# Patient Record
Sex: Female | Born: 1980 | Race: White | Hispanic: No | Marital: Married | Smoking: Former smoker
Health system: Southern US, Community
[De-identification: ages and names within clinical notes are randomized; demographics above are authoritative.]

## PROBLEM LIST (undated history)

## (undated) DIAGNOSIS — F329 Major depressive disorder, single episode, unspecified: Secondary | ICD-10-CM

## (undated) DIAGNOSIS — K589 Irritable bowel syndrome without diarrhea: Secondary | ICD-10-CM

## (undated) DIAGNOSIS — M2550 Pain in unspecified joint: Secondary | ICD-10-CM

## (undated) DIAGNOSIS — Z803 Family history of malignant neoplasm of breast: Secondary | ICD-10-CM

## (undated) DIAGNOSIS — I10 Essential (primary) hypertension: Secondary | ICD-10-CM

## (undated) DIAGNOSIS — R7303 Prediabetes: Secondary | ICD-10-CM

## (undated) DIAGNOSIS — G8929 Other chronic pain: Secondary | ICD-10-CM

## (undated) DIAGNOSIS — Z87442 Personal history of urinary calculi: Secondary | ICD-10-CM

## (undated) DIAGNOSIS — K625 Hemorrhage of anus and rectum: Secondary | ICD-10-CM

## (undated) DIAGNOSIS — J9601 Acute respiratory failure with hypoxia: Secondary | ICD-10-CM

## (undated) DIAGNOSIS — F32A Depression, unspecified: Secondary | ICD-10-CM

## (undated) DIAGNOSIS — J9691 Respiratory failure, unspecified with hypoxia: Secondary | ICD-10-CM

## (undated) DIAGNOSIS — J111 Influenza due to unidentified influenza virus with other respiratory manifestations: Secondary | ICD-10-CM

## (undated) DIAGNOSIS — F411 Generalized anxiety disorder: Secondary | ICD-10-CM

## (undated) HISTORY — DX: Family history of malignant neoplasm of breast: Z80.3

---
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
## 2008-08-02 ENCOUNTER — Observation Stay: Payer: Self-pay

## 2008-08-04 ENCOUNTER — Inpatient Hospital Stay: Payer: Self-pay

## 2016-10-31 ENCOUNTER — Emergency Department: Payer: BC Managed Care – PPO

## 2016-10-31 ENCOUNTER — Emergency Department
Admission: EM | Admit: 2016-10-31 | Discharge: 2016-10-31 | Disposition: A | Discharge status: Home / Self Care | Payer: BC Managed Care – PPO | Attending: Emergency Medicine | Admitting: Emergency Medicine

## 2016-10-31 ENCOUNTER — Encounter: Payer: Self-pay | Admitting: Emergency Medicine

## 2016-10-31 DIAGNOSIS — N202 Calculus of kidney with calculus of ureter: Secondary | ICD-10-CM | POA: Insufficient documentation

## 2016-10-31 DIAGNOSIS — N200 Calculus of kidney: Secondary | ICD-10-CM

## 2016-10-31 DIAGNOSIS — R109 Unspecified abdominal pain: Secondary | ICD-10-CM | POA: Diagnosis present

## 2016-10-31 LAB — URINALYSIS COMPLETE WITH MICROSCOPIC (ARMC ONLY)
BILIRUBIN URINE: NEGATIVE
Glucose, UA: NEGATIVE mg/dL
Ketones, ur: NEGATIVE mg/dL
Nitrite: NEGATIVE
PH: 5 (ref 5.0–8.0)
PROTEIN: 100 mg/dL — AB
Specific Gravity, Urine: 1.025 (ref 1.005–1.030)

## 2016-10-31 LAB — COMPREHENSIVE METABOLIC PANEL
ALBUMIN: 3.8 g/dL (ref 3.5–5.0)
ALT: 24 U/L (ref 14–54)
AST: 24 U/L (ref 15–41)
Alkaline Phosphatase: 68 U/L (ref 38–126)
Anion gap: 9 (ref 5–15)
BUN: 15 mg/dL (ref 6–20)
CHLORIDE: 110 mmol/L (ref 101–111)
CO2: 19 mmol/L — AB (ref 22–32)
CREATININE: 0.95 mg/dL (ref 0.44–1.00)
Calcium: 8.8 mg/dL — ABNORMAL LOW (ref 8.9–10.3)
GFR calc Af Amer: >60 mL/min (ref >60)
GLUCOSE: 103 mg/dL — AB (ref 65–99)
Potassium: 3.7 mmol/L (ref 3.5–5.1)
Sodium: 138 mmol/L (ref 135–145)
Total Bilirubin: 0.3 mg/dL (ref 0.3–1.2)
Total Protein: 7.6 g/dL (ref 6.5–8.1)

## 2016-10-31 LAB — CBC WITH DIFFERENTIAL/PLATELET
BASOS ABS: 0.1 10*3/uL (ref 0–0.1)
BASOS PCT: 1 %
EOS ABS: 0.1 10*3/uL (ref 0–0.7)
Eosinophils Relative: 0 %
HEMATOCRIT: 43.4 % (ref 35.0–47.0)
HEMOGLOBIN: 14.7 g/dL (ref 12.0–16.0)
Lymphocytes Relative: 20 %
Lymphs Abs: 2.8 10*3/uL (ref 1.0–3.6)
MCH: 30.7 pg (ref 26.0–34.0)
MCHC: 33.8 g/dL (ref 32.0–36.0)
MCV: 90.8 fL (ref 80.0–100.0)
MONOS PCT: 7 %
Monocytes Absolute: 1 10*3/uL — ABNORMAL HIGH (ref 0.2–0.9)
NEUTROS ABS: 10 10*3/uL — AB (ref 1.4–6.5)
NEUTROS PCT: 72 %
Platelets: 323 10*3/uL (ref 150–440)
RBC: 4.77 MIL/uL (ref 3.80–5.20)
RDW: 13.2 % (ref 11.5–14.5)
WBC: 13.9 10*3/uL — AB (ref 3.6–11.0)

## 2016-10-31 LAB — PREGNANCY, URINE: PREG TEST UR: NEGATIVE

## 2016-10-31 MED ORDER — ONDANSETRON HCL 4 MG/2ML IJ SOLN
INTRAMUSCULAR | Status: AC
  Filled 2016-10-31: qty 2

## 2016-10-31 MED ORDER — ONDANSETRON HCL 4 MG/2ML IJ SOLN
4.0000 mg | Freq: Once | INTRAMUSCULAR | Status: AC
  Administered 2016-10-31: 4 mg via INTRAVENOUS

## 2016-10-31 MED ORDER — HYDROMORPHONE HCL 1 MG/ML IJ SOLN
1.0000 mg | Freq: Once | INTRAMUSCULAR | Status: AC
  Administered 2016-10-31: 1 mg via INTRAVENOUS

## 2016-10-31 MED ORDER — OXYCODONE-ACETAMINOPHEN 7.5-325 MG PO TABS: 1.0000 | ORAL_TABLET | ORAL | 0 refills | Status: AC | PRN

## 2016-10-31 MED ORDER — KETOROLAC TROMETHAMINE 30 MG/ML IJ SOLN
INTRAMUSCULAR | Status: AC
  Administered 2016-10-31: 30 mg via INTRAVENOUS
  Filled 2016-10-31: qty 1

## 2016-10-31 MED ORDER — HYDROMORPHONE HCL 1 MG/ML IJ SOLN
INTRAMUSCULAR | Status: AC
  Administered 2016-10-31: 1 mg via INTRAVENOUS
  Filled 2016-10-31: qty 1

## 2016-10-31 MED ORDER — ONDANSETRON HCL 4 MG/2ML IJ SOLN
INTRAMUSCULAR | Status: AC
  Administered 2016-10-31: 4 mg via INTRAVENOUS
  Filled 2016-10-31: qty 2

## 2016-10-31 MED ORDER — TAMSULOSIN HCL 0.4 MG PO CAPS: 0.4000 mg | ORAL_CAPSULE | Freq: Every day | ORAL | 0 refills | Status: DC

## 2016-10-31 MED ORDER — KETOROLAC TROMETHAMINE 30 MG/ML IJ SOLN
30.0000 mg | Freq: Once | INTRAMUSCULAR | Status: AC
  Administered 2016-10-31: 30 mg via INTRAVENOUS

## 2016-10-31 MED ORDER — CEPHALEXIN 250 MG PO CAPS: 250.0000 mg | ORAL_CAPSULE | Freq: Four times a day (QID) | ORAL | 0 refills | Status: AC

## 2016-10-31 MED ORDER — ONDANSETRON HCL 4 MG PO TABS: 4.0000 mg | ORAL_TABLET | Freq: Every day | ORAL | 1 refills | Status: DC | PRN

## 2016-10-31 NOTE — ED Triage Notes (Signed)
Pt with right side flank pain radiating around into back. Pt with hx of kidney stones.

## 2016-10-31 NOTE — ED Provider Notes (Signed)
[facility] Emergency Department Provider Note        Time seen: ----------------------------------------- 5:32 PM on 10/31/2016 -----------------------------------------    I have reviewed the triage vital signs and the nursing notes.   HISTORY  Chief Complaint Flank Pain    HPI Evelyn Beeker is a 35 y.o. female who presents the ER for right-sided flank pain that radiates into her back. She doesn't history kidney stones, she's had associated nausea and vomiting with the pain. She states her daughter has been sick so she initially thought she had a virus. Subsequently the right flank pain started. Pain is 9 out of 10   History reviewed. No pertinent past medical history.  There are no active problems to display for this patient.   History reviewed. No pertinent surgical history.  Allergies Phenergan [promethazine hcl]  Social History Social History  Substance Use Topics  . Smoking status: Never Smoker  . Smokeless tobacco: Never Used  . Alcohol use No    Review of Systems Constitutional: Negative for fever. Cardiovascular: Negative for chest pain. Respiratory: Negative for shortness of breath. Gastrointestinal: Positive for flank pain, vomiting Genitourinary: Negative for dysuria. Musculoskeletal: Negative for back pain. Skin: Negative for rash. Neurological: Negative for headaches, focal weakness or numbness.  10-point ROS otherwise negative.  ____________________________________________   PHYSICAL EXAM:  VITAL SIGNS: ED Triage Vitals  Enc Vitals Group     BP 10/31/16 1655 (!) 187/111     Pulse Rate 10/31/16 1655 79     Resp 10/31/16 1655 18     Temp 10/31/16 1655 97.5 F (36.4 C)     Temp Source 10/31/16 1655 Oral     SpO2 10/31/16 1655 100 %     Weight 10/31/16 1656 280 lb (127 kg)     Height 10/31/16 1656 6\' 1"  (1.854 m)     Head Circumference --      Peak Flow --      Pain Score 10/31/16 1655 9     Pain Loc --       Pain Edu? --      Excl. in [place]? --     Constitutional: Alert and oriented. Mild distress Eyes: Conjunctivae are normal. PERRL. Normal extraocular movements. ENT   Head: Normocephalic and atraumatic.   Nose: No congestion/rhinnorhea.   Mouth/Throat: Mucous membranes are moist.   Neck: No stridor. Cardiovascular: Normal rate, regular rhythm. No murmurs, rubs, or gallops. Respiratory: Normal respiratory effort without tachypnea nor retractions. Breath sounds are clear and equal bilaterally. No wheezes/rales/rhonchi. Gastrointestinal: Right flank tenderness, normal bowel sounds. Musculoskeletal: Nontender with normal range of motion in all extremities. No lower extremity tenderness nor edema. Neurologic:  Normal speech and language. No gross focal neurologic deficits are appreciated.  Skin:  Skin is warm, dry and intact. No rash noted. Psychiatric: Mood and affect are normal. Speech and behavior are normal.  ____________________________________________  ED COURSE:  Pertinent labs & imaging results that were available during my care of the patient were reviewed by me and considered in my medical decision making (see chart for details). Clinical Course   Patient is in mild distress from likely renal colic. We'll check basic labs and imaging.  Procedures ____________________________________________   LABS (pertinent positives/negatives)  Labs Reviewed  URINALYSIS COMPLETEWITH MICROSCOPIC ([facility] ONLY) - Abnormal; Notable for the following:       Result Value   Color, Urine YELLOW (*)    APPearance CLOUDY (*)    Hgb urine dipstick 3+ (*)  Protein, ur 100 (*)    Leukocytes, UA 2+ (*)    Bacteria, UA FEW (*)    Squamous Epithelial / LPF TOO NUMEROUS TO COUNT (*)    All other components within normal limits  CBC WITH DIFFERENTIAL/PLATELET - Abnormal; Notable for the following:    WBC 13.9 (*)    Neutro Abs 10.0 (*)    Monocytes Absolute 1.0 (*)    All other  components within normal limits  COMPREHENSIVE METABOLIC PANEL - Abnormal; Notable for the following:    CO2 19 (*)    Glucose, Bld 103 (*)    Calcium 8.8 (*)    All other components within normal limits  PREGNANCY, URINE    RADIOLOGY Images were viewed by me  KUB IMPRESSION: 6 mm calculus overlying the right lower kidney.  Possible 4 mm distal right ureteral calculus versus pelvic phlebolith. ____________________________________________  FINAL ASSESSMENT AND PLAN  Flank pain, Renal colic  Plan: Patient with labs and imaging as dictated above. Patient presented with flank pain consistent with renal colic. Labs and imaging are indicative of same. She'll be discharged with pain medicine, Flomax and antiemetics. She'll be referred to urology for outpatient follow-up.   Earleen Newport, MD   Note: This dictation was prepared with Dragon dictation. Any transcriptional errors that result from this process are unintentional    Earleen Newport, MD 10/31/16 1850

## 2016-11-28 ENCOUNTER — Encounter: Payer: Self-pay | Admitting: *Deleted

## 2016-11-29 ENCOUNTER — Ambulatory Visit
Admission: RE | Admit: 2016-11-29 | Discharge: 2016-11-29 | Disposition: A | Discharge status: Home / Self Care | Payer: BC Managed Care – PPO | Source: Ambulatory Visit | Attending: Urology | Admitting: Urology

## 2016-11-29 ENCOUNTER — Encounter
Admission: RE | Disposition: A | Discharge status: Home / Self Care | Payer: Self-pay | Source: Ambulatory Visit | Attending: Urology

## 2016-11-29 ENCOUNTER — Encounter: Payer: Self-pay | Admitting: *Deleted

## 2016-11-29 DIAGNOSIS — E669 Obesity, unspecified: Secondary | ICD-10-CM | POA: Diagnosis not present

## 2016-11-29 DIAGNOSIS — N200 Calculus of kidney: Secondary | ICD-10-CM

## 2016-11-29 DIAGNOSIS — F419 Anxiety disorder, unspecified: Secondary | ICD-10-CM | POA: Insufficient documentation

## 2016-11-29 DIAGNOSIS — Z87891 Personal history of nicotine dependence: Secondary | ICD-10-CM | POA: Insufficient documentation

## 2016-11-29 DIAGNOSIS — Z6837 Body mass index (BMI) 37.0-37.9, adult: Secondary | ICD-10-CM | POA: Diagnosis not present

## 2016-11-29 DIAGNOSIS — F329 Major depressive disorder, single episode, unspecified: Secondary | ICD-10-CM | POA: Diagnosis not present

## 2016-11-29 DIAGNOSIS — Z79899 Other long term (current) drug therapy: Secondary | ICD-10-CM | POA: Insufficient documentation

## 2016-11-29 HISTORY — DX: Personal history of urinary calculi: Z87.442

## 2016-11-29 HISTORY — DX: Other chronic pain: G89.29

## 2016-11-29 HISTORY — DX: Pain in unspecified joint: M25.50

## 2016-11-29 HISTORY — DX: Depression, unspecified: F32.A

## 2016-11-29 HISTORY — PX: EXTRACORPOREAL SHOCK WAVE LITHOTRIPSY: SHX1557

## 2016-11-29 HISTORY — DX: Major depressive disorder, single episode, unspecified: F32.9

## 2016-11-29 LAB — POCT PREGNANCY, URINE: Preg Test, Ur: NEGATIVE

## 2016-11-29 SURGERY — LITHOTRIPSY, ESWL
Anesthesia: Moderate Sedation | Laterality: Right

## 2016-11-29 MED ORDER — ONDANSETRON HCL 4 MG/2ML IJ SOLN
INTRAMUSCULAR | Status: AC
  Filled 2016-11-29: qty 8

## 2016-11-29 MED ORDER — MORPHINE SULFATE (PF) 10 MG/ML IV SOLN
INTRAVENOUS | Status: AC
  Filled 2016-11-29: qty 1

## 2016-11-29 MED ORDER — MIDAZOLAM HCL 2 MG/2ML IJ SOLN
INTRAMUSCULAR | Status: AC
  Filled 2016-11-29: qty 2

## 2016-11-29 MED ORDER — MIDAZOLAM HCL 2 MG/2ML IJ SOLN
1.0000 mg | Freq: Once | INTRAMUSCULAR | Status: AC
  Administered 2016-11-29: 1 mg via INTRAMUSCULAR

## 2016-11-29 MED ORDER — DIPHENHYDRAMINE HCL 25 MG PO CAPS
25.0000 mg | ORAL_CAPSULE | ORAL | Status: AC
  Administered 2016-11-29: 25 mg via ORAL

## 2016-11-29 MED ORDER — LEVOFLOXACIN 500 MG PO TABS
500.0000 mg | ORAL_TABLET | ORAL | Status: AC
  Administered 2016-11-29: 500 mg via ORAL

## 2016-11-29 MED ORDER — LEVOFLOXACIN 500 MG PO TABS
ORAL_TABLET | ORAL | Status: AC
  Filled 2016-11-29: qty 1

## 2016-11-29 MED ORDER — DEXTROSE-NACL 5-0.45 % IV SOLN
INTRAVENOUS | Status: DC
  Administered 2016-11-29: 14:00:00 via INTRAVENOUS

## 2016-11-29 MED ORDER — DIPHENHYDRAMINE HCL 25 MG PO CAPS
ORAL_CAPSULE | ORAL | Status: AC
  Filled 2016-11-29: qty 1

## 2016-11-29 MED ORDER — MORPHINE SULFATE (PF) 10 MG/ML IV SOLN
10.0000 mg | Freq: Once | INTRAVENOUS | Status: AC
  Administered 2016-11-29: 10 mg via INTRAMUSCULAR

## 2016-11-29 MED ORDER — FUROSEMIDE 10 MG/ML IJ SOLN
10.0000 mg | Freq: Once | INTRAMUSCULAR | Status: AC
  Administered 2016-11-29: 10 mg via INTRAVENOUS

## 2016-11-29 MED ORDER — ONDANSETRON HCL 4 MG/2ML IJ SOLN
4.0000 mg | Freq: Once | INTRAMUSCULAR | Status: AC
  Administered 2016-11-29: 4 mg via INTRAVENOUS

## 2016-11-29 MED ORDER — FUROSEMIDE 10 MG/ML IJ SOLN
INTRAMUSCULAR | Status: AC
Start: 2016-11-29 — End: 2016-11-29
  Administered 2016-11-29: 10 mg via INTRAVENOUS
  Filled 2016-11-29: qty 2

## 2016-11-29 NOTE — Discharge Instructions (Signed)
Dietary Guidelines to Help Prevent Kidney Stones Your risk of kidney stones can be decreased by adjusting the foods you eat. The most important thing you can do is drink enough fluid. You should drink enough fluid to keep your urine clear or pale yellow. The following guidelines provide specific information for the type of kidney stone you have had. Guidelines according to type of kidney stone Calcium Oxalate Kidney Stones  Reduce the amount of salt you eat. Foods that have a lot of salt cause your body to release excess calcium into your urine. The excess calcium can combine with a substance called oxalate to form kidney stones.  Reduce the amount of animal protein you eat if the amount you eat is excessive. Animal protein causes your body to release excess calcium into your urine. Ask your dietitian how much protein from animal sources you should be eating.  Avoid foods that are high in oxalates. If you take vitamins, they should have less than 500 mg of vitamin C. Your body turns vitamin C into oxalates. You do not need to avoid fruits and vegetables high in vitamin C. Calcium Phosphate Kidney Stones  Reduce the amount of salt you eat to help prevent the release of excess calcium into your urine.  Reduce the amount of animal protein you eat if the amount you eat is excessive. Animal protein causes your body to release excess calcium into your urine. Ask your dietitian how much protein from animal sources you should be eating.  Get enough calcium from food or take a calcium supplement (ask your dietitian for recommendations). Food sources of calcium that do not increase your risk of kidney stones include:  Broccoli.  Dairy products, such as cheese and yogurt.  Pudding. Uric Acid Kidney Stones  Do not have more than 6 oz of animal protein per day. Food sources Publishing rights manager (all types).  Poultry.  Eggs.  Fish, seafood. Foods High in [employer] seasonings.  Soy  sauce.  Teriyaki sauce.  Cured and processed meats.  Salted crackers and snack foods.  Fast food.  Canned soups and most canned foods. Foods High in Oxalates  Grains:  Amaranth.  Barley.  Grits.  Wheat germ.  Bran.  Buckwheat flour.  All bran cereals.  Pretzels.  Whole wheat bread.  Vegetables:  Beans (wax).  Beets and beet greens.  Collard greens.  Eggplant.  [place].  Leeks.  Okra.  Parsley.  Rutabagas.  Spinach.  Swiss chard.  Tomato paste.  Fried potatoes.  Sweet potatoes.  Fruits:  Red currants.  Figs.  Kiwi.  Rhubarb.  Meat and Other Protein Sources:  Beans (dried).  Soy burgers and other soybean products.  Miso.  Nuts (peanuts, almonds, pecans, cashews, hazelnuts).  Nut butters.  Sesame seeds and tahini (paste made of sesame seeds).  Poppy seeds.  Beverages:  Chocolate drink mixes.  Soy milk.  Instant iced tea.  Juices made from high-oxalate fruits or vegetables.  Other:  Carob.  Chocolate.  Fruitcake.  Marmalades. This information is not intended to replace advice given to you by your health care provider. Make sure you discuss any questions you have with your health care provider. Document Released: 04/06/2011 Document Revised: 05/17/2016 Document Reviewed: 11/06/2013 Elsevier Interactive Patient Education  2017 [place].   Kidney Stones Kidney stones (urolithiasis) are solid, rock-like deposits that form inside of the organs that make urine (kidneys). A kidney stone may form in a kidney and move into the bladder, where  it can cause intense pain and block the flow of urine. Kidney stones are created when high levels of certain minerals are found in the urine. They are usually passed through urination, but in some cases, medical treatment may be needed to remove them. What are the causes? Kidney stones may be caused by:  A condition in which certain glands produce too much parathyroid  hormone (primary hyperparathyroidism), which causes too much calcium buildup in the blood.  Buildup of uric acid crystals in the bladder (hyperuricosuria). Uric acid is a chemical that the body produces when you eat certain foods. It usually exits the body in the urine.  Narrowing (stricture) of one or both of the tubes that drain urine from the kidneys to the bladder (ureters).  A kidney blockage that is present at birth (congenital obstruction).  Past surgery on the kidney or the ureters, such as gastric bypass surgery. What increases the risk? The following factors make you more likely to develop kidney stones:  Having had a kidney stone in the past.  Having a family history of kidney stones.  Not drinking enough water.  Eating a diet that is high in protein, salt (sodium), or sugar.  Being overweight or obese. What are the signs or symptoms? Symptoms of a kidney stone may include:  Nausea.  Vomiting.  Blood in the urine (hematuria).  Pain in the side of the abdomen, right below the ribs (flank pain). Pain usually spreads (radiates) to the groin.  Needing to urinate frequently or urgently. How is this diagnosed? This condition may be diagnosed based on:  Your medical history.  A physical exam.  Blood tests.  Urine tests.  CT scan.  Abdominal X-ray.  A procedure to examine the inside of the bladder (cystoscopy). How is this treated? Treatment for kidney stones depends on the size, location, and makeup of the stones. Treatment may involve:  Analyzing your urine before and after you pass the stone through urination.  Being monitored at the hospital until you pass the stone through urination.  Increasing your fluid intake and decreasing the amount of calcium and protein in your diet.  A procedure to break up kidney stones in the bladder using:  A focused beam of light (laser therapy).  Shock waves (extracorporeal shock wave lithotripsy).  Surgery to  remove kidney stones. This may be needed if you have severe pain or have stones that block your urinary tract. Follow these instructions at home: Eating and drinking  Drink enough fluid to keep your urine clear or pale yellow. This will help you to pass the kidney stone.  If directed, change your diet. This may include:  Limiting how much sodium you eat.  Eating more fruits and vegetables.  Limiting how much meat, poultry, fish, and eggs you eat.  Follow instructions from your health care provider about eating or drinking restrictions. General instructions  Collect urine samples as told by your health care provider. You may need to collect a urine sample:  24 hours after you pass the stone.  8-12 weeks after passing the kidney stone, and every 6-12 months after that.  Strain your urine every time you urinate, for as long as directed. Use the strainer that your health care provider recommends.  Do not throw out the kidney stone after passing it. Keep the stone so it can be tested by your health care provider. Testing the makeup of your kidney stone may help prevent you from getting kidney stones in the future.  Take over-the-counter and prescription medicines only as told by your health care provider.  Keep all follow-up visits as told by your health care provider. This is important. You may need follow-up X-rays or ultrasounds to make sure that your stone has passed. How is this prevented? To prevent another kidney stone:  Drink enough fluid to keep your urine clear or pale yellow. This is the best way to prevent kidney stones.  Eat a healthy diet and follow recommendations from your health care provider about foods to avoid. You may be instructed to eat a low-protein diet. Recommendations vary depending on the type of kidney stone that you have.  Maintain a healthy weight. Contact a health care provider if:  You have pain that gets worse or does not get better with  medicine. Get help right away if:  You have a fever or chills.  You develop severe pain.  You develop new abdominal pain.  You faint.  You are unable to urinate. This information is not intended to replace advice given to you by your health care provider. Make sure you discuss any questions you have with your health care provider. Document Released: 12/10/2005 Document Revised: 06/29/2016 Document Reviewed: 05/25/2016 Elsevier Interactive Patient Education  2017 [place].   Renal Colic Renal colic is pain that is caused by passing a kidney stone. The pain can be sharp and severe. It may be felt in the back, abdomen, side (flank), or groin. It can cause nausea. Renal colic can come and go. Follow these instructions at home: Watch your condition for any changes. The following actions may help to lessen any discomfort that you are feeling:  Take medicines only as directed by your health care provider.  Ask your health care provider if it is okay to take over-the-counter pain medicine.  Drink enough fluid to keep your urine clear or pale yellow. Drink 6-8 glasses of water each day.  Limit the amount of salt that you eat to less than 2 grams per day.  Reduce the amount of protein in your diet. Eat less meat, fish, nuts, and dairy.  Avoid foods such as spinach, rhubarb, nuts, or bran. These may make kidney stones more likely to form. Contact a health care provider if:  You have a fever or chills.  Your urine smells bad or looks cloudy.  You have pain or burning when you pass urine. Get help right away if:  Your flank pain or groin pain suddenly worsens.  You become confused or disoriented or you lose consciousness. This information is not intended to replace advice given to you by your health care provider. Make sure you discuss any questions you have with your health care provider. Document Released: 09/19/2005 Document Revised: 05/15/2016 Document Reviewed:  10/20/2014 Elsevier Interactive Patient Education  2017 [place]. Lithotripsy Lithotripsy is a treatment that can sometimes help eliminate kidney stones and pain that they cause. A form of lithotripsy, also known as extracorporeal shock wave lithotripsy, is a nonsurgical procedure that helps your body rid itself of the kidney stone when it is too big to pass on its own. Extracorporeal shock wave lithotripsy is a method of crushing a kidney stone with shock waves. These shock waves pass through your body and are focused on your stone. They cause the kidney stones to crumble while still in the urinary tract. It is then easier for the smaller pieces of stone to pass in the urine. Lithotripsy usually takes about an hour. It is done in  a hospital, a lithotripsy center, or a mobile unit. It usually does not require an overnight stay. Your health care provider will instruct you on preparation for the procedure. Your health care provider will tell you what to expect afterward. LET [facility] CARE PROVIDER KNOW ABOUT:  Any allergies you have.  All medicines you are taking, including vitamins, herbs, eye drops, creams, and over-the-counter medicines.  Previous problems you or members of your family have had with the use of anesthetics.  Any blood disorders you have.  Previous surgeries you have had.  Medical conditions you have. RISKS AND COMPLICATIONS Generally, lithotripsy for kidney stones is a safe procedure. However, as with any procedure, complications can occur. Possible complications include:  Infection.  Bleeding of the kidney.  Bruising of the kidney or skin.  Obstruction of the ureter.  Failure of the stone to fragment. BEFORE THE PROCEDURE  Do not eat or drink for 6-8 hours prior to the procedure. You may, however, take the medications with a sip of water that your physician instructs you to take  Do not take aspirin or aspirin-containing products for 7 days prior to your  procedure  Do not take nonsteroidal anti-inflammatory products for 7 days prior to your procedure PROCEDURE A stent (flexible tube with holes) may be placed in your ureter. The ureter is the tube that transports the urine from the kidneys to the bladder. Your health care provider may place a stent before the procedure. This will help keep urine flowing from the kidney if the fragments of the stone block the ureter. You may have an IV tube placed in one of your veins to give you fluids and medicines. These medicines may help you relax or make you sleep. During the procedure, you will lie comfortably on a fluid-filled cushion or in a warm-water bath. After an X-ray or ultrasound exam to locate your stone, shock waves are aimed at the stone. If you are awake, you may feel a tapping sensation as the shock waves pass through your body. If large stone particles remain after treatment, a second procedure may be necessary at a later date. For comfort during the test:  Relax as much as possible.  Try to remain still as much as possible.  Try to follow instructions to speed up the test.  Let your health care provider know if you are uncomfortable, anxious, or in pain. AFTER THE PROCEDURE  After surgery, you will be taken to the recovery area. A nurse will watch and check your progress. Once you're awake, stable, and taking fluids well, you will be allowed to go home as long as there are no problems. You will also be allowed to pass your urine before discharge.You may be given antibiotics to help prevent infection. You may also be prescribed pain medicine if needed. In a week or two, your health care provider may remove your stent, if you have one. You may first have an X-ray exam to check on how successful the fragmentation of your stone has been and how much of the stone has passed. Your health care provider will check to see whether or not stone particles remain. SEEK IMMEDIATE MEDICAL CARE IF:  You  develop a fever or shaking chills.  Your pain is not relieved by medicine.  You feel sick to your stomach (nauseated) and you vomit.  You develop heavy bleeding.  You have difficulty urinating.  You start to pass your stent from your penis. This information is not intended to  replace advice given to you by your health care provider. Make sure you discuss any questions you have with your health care provider. Document Released: 12/07/2000 Document Revised: 12/31/2014 Document Reviewed: 06/25/2013 Elsevier Interactive Patient Education  2017 [place]. Lithotripsy, Care After Refer to this sheet in the next few weeks. These instructions provide you with information on caring for yourself after your procedure. Your health care provider may also give you more specific instructions. Your treatment has been planned according to current medical practices, but problems sometimes occur. Call your health care provider if you have any problems or questions after your procedure. WHAT TO EXPECT AFTER THE PROCEDURE   Your urine may have a red tinge for a few days after treatment. Blood loss is usually minimal.  You may have soreness in the back or flank area. This usually goes away after a few days. The procedure can cause blotches or bruises on the back where the pressure wave enters the skin. These marks usually cause only minimal discomfort and should disappear in a short time.  Stone fragments should begin to pass within 24 hours of treatment. However, a delayed passage is not unusual.  You may have pain, discomfort, and feel sick to your stomach (nauseated) when the crushed fragments of stone are passed down the tube from the kidney to the bladder. Stone fragments can pass soon after the procedure and may last for up to 4-8 weeks.  A small number of patients may have severe pain when stone fragments are not able to pass, which leads to an obstruction.  If your stone is greater than 1 inch (2.5  cm) in diameter or if you have multiple stones that have a combined diameter greater than 1 inch (2.5 cm), you may require more than one treatment.  If you had a stent placed prior to your procedure, you may experience some discomfort, especially during urination. You may experience the pain or discomfort in your flank or back, or you may experience a sharp pain or discomfort at the base of your penis or in your lower abdomen. The discomfort usually lasts only a few minutes after urinating. HOME CARE INSTRUCTIONS   Rest at home until you feel your energy improving.  Only take over-the-counter or prescription medicines for pain, discomfort, or fever as directed by your health care provider. Depending on the type of lithotripsy, you may need to take antibiotics and anti-inflammatory medicines for a few days.  Drink enough water and fluids to keep your urine clear or pale yellow. This helps "flush" your kidneys. It helps pass any remaining pieces of stone and prevents stones from coming back.  Most people can resume daily activities within 1-2 days after standard lithotripsy. It can take longer to recover from laser and percutaneous lithotripsy.  Strain all urine through the provided strainer. Keep all particulate matter and stones for your health care provider to see. The stone may be as small as a grain of salt. It is very important to use the strainer each and every time you pass your urine. Any stones that are found can be sent to a medical lab for examination.  Visit your health care provider for a follow-up appointment in a few weeks. Your doctor may remove your stent if you have one. Your health care provider will also check to see whether stone particles still remain. SEEK MEDICAL CARE IF:   Your pain is not relieved by medicine.  You have a lasting nauseous feeling.  You feel  there is too much blood in the urine.  You develop persistent problems with frequent or painful urination that  does not at least partially improve after 2 days following the procedure.  You have a congested cough.  You feel lightheaded.  You develop a rash or any other signs that might suggest an allergic problem.  You develop any reaction or side effects to your medicine(s). SEEK IMMEDIATE MEDICAL CARE IF:   You experience severe back or flank pain or both.  You see nothing but blood when you urinate.  You cannot pass any urine at all.  You have a fever or shaking chills.  You develop shortness of breath, difficulty breathing, or chest pain.  You develop vomiting that will not stop after 6-8 hours.  You have a fainting episode. This information is not intended to replace advice given to you by your health care provider. Make sure you discuss any questions you have with your health care provider. Document Released: 12/30/2007 Document Revised: 08/31/2015 Document Reviewed: 06/25/2013 Elsevier Interactive Patient Education  2017 [employer].

## 2016-11-29 NOTE — OR Nursing (Signed)
Right flank with area of redness softball size

## 2016-11-30 ENCOUNTER — Encounter: Payer: Self-pay | Admitting: Urology

## 2017-08-14 ENCOUNTER — Ambulatory Visit: Payer: Self-pay | Admitting: Advanced Practice Midwife

## 2017-08-22 ENCOUNTER — Encounter: Payer: Self-pay | Admitting: Advanced Practice Midwife

## 2017-08-22 ENCOUNTER — Ambulatory Visit (INDEPENDENT_AMBULATORY_CARE_PROVIDER_SITE_OTHER): Payer: BC Managed Care – PPO | Admitting: Advanced Practice Midwife

## 2017-08-22 VITALS — BP 128/84 | Ht 72.0 in | Wt 307.0 lb

## 2017-08-22 DIAGNOSIS — Z01419 Encounter for gynecological examination (general) (routine) without abnormal findings: Secondary | ICD-10-CM

## 2017-08-22 DIAGNOSIS — N921 Excessive and frequent menstruation with irregular cycle: Secondary | ICD-10-CM

## 2017-08-22 DIAGNOSIS — Z0000 Encounter for general adult medical examination without abnormal findings: Secondary | ICD-10-CM | POA: Diagnosis not present

## 2017-08-22 MED ORDER — NORGESTIMATE-ETH ESTRADIOL 0.25-35 MG-MCG PO TABS: 1.0000 | ORAL_TABLET | Freq: Every day | ORAL | 11 refills | Status: DC

## 2017-08-22 NOTE — Progress Notes (Signed)
Patient ID: Evelyn Estrada, female   DOB: Aug 03, 1981, 36 y.o.   MRN: [id number]     Gynecology Annual Exam  PCP: [facility]  Chief Complaint:  Chief Complaint  Patient presents with  . Annual Exam    History of Present Illness: Patient is a 36 y.o. G3O7564 presents for annual exam. The patient has complaint today of frequent and heavy menstrual cycles. She has a period every 2-3 weeks and they last for 5 days, they are heavy with clots and cramping. She has been on OCP from January to about May and now would like to restart the pills due to her bleeding. She has taken Lo Lo Estrin in the past but thought it affected her blood pressure. More recently she took [place] which she thought worked better.    LMP: Patient's last menstrual period was 08/19/2017. Intermenstrual Bleeding: no Postcoital Bleeding: no Dysmenorrhea: yes  The patient is sexually active. She currently uses condoms for contraception. She denies dyspareunia.  The patient does occasionally perform self breast exams.  There is no notable family history of breast or ovarian cancer in her family. Her paternal grandfather had possible breast cancer at unknown age.   The patient wears seatbelts: yes.   The patient has regular exercise: no.    The patient denies current symptoms of depression.  Her symptoms are well controlled by her current dose of Evelyn Estrada  Review of Systems: Review of Systems  Constitutional: Negative.   HENT: Negative.   Eyes: Negative.   Respiratory: Negative.   Cardiovascular: Negative.   Gastrointestinal: Negative.   Genitourinary: Negative.   Musculoskeletal: Negative.   Skin: Negative.   Neurological: Negative.   Endo/Heme/Allergies: Negative.   Psychiatric/Behavioral: Negative.     Past Medical History:  Past Medical History:  Diagnosis Date  . Chronic joint pain   . Depression   . History of kidney stones    several times    Past Surgical  History:  Past Surgical History:  Procedure Laterality Date  . EXTRACORPOREAL SHOCK WAVE LITHOTRIPSY Right 11/29/2016   Procedure: EXTRACORPOREAL SHOCK WAVE LITHOTRIPSY (ESWL);  Surgeon: Royston Cowper, MD;  Location: [facility] ORS;  Service: Urology;  Laterality: Right;  . TUBAL LIGATION  2002    Gynecologic History:  Patient's last menstrual period was 08/19/2017. Contraception: condoms Last Pap: Results were: no abnormalities   Obstetric History: [id number]  Family History:  History reviewed. No pertinent family history.  Social History:  Social History   Social History  . Marital status: Married    Spouse name: N/A  . Number of children: N/A  . Years of education: N/A   Occupational History  . Not on file.   Social History Main Topics  . Smoking status: Former Smoker    Types: Cigarettes    Quit date: 12/24/2000  . Smokeless tobacco: Never Used  . Alcohol use No  . Drug use: No  . Sexual activity: Yes    Birth control/ protection: None   Other Topics Concern  . Not on file   Social History Narrative  . No narrative on file    Allergies:  Allergies  Allergen Reactions  . Phenergan [Promethazine Hcl]     rash    Medications: Prior to Admission medications   Medication Sig Start Date End Date Taking? Authorizing Provider  escitalopram (Evelyn Estrada) 10 MG tablet Take 10 mg by mouth daily.   Yes [provider]  ibuprofen (ADVIL,MOTRIN) 200 MG tablet Take 600  mg by mouth every 6 (six) hours as needed for moderate pain.   Yes [provider]  ondansetron (ZOFRAN) 4 MG tablet Take 1 tablet (4 mg total) by mouth daily as needed for nausea or vomiting. 10/31/16  Yes Earleen Newport, MD  norgestimate-ethinyl estradiol (ORTHO-CYCLEN,SPRINTEC,PREVIFEM) 0.25-35 MG-MCG tablet Take 1 tablet by mouth daily. 08/22/17   Rod Can, CNM  oxyCODONE-acetaminophen (PERCOCET) 7.5-325 MG tablet Take 1 tablet by mouth every 4 (four) hours as needed for severe  pain. Patient not taking: Reported on 08/22/2017 10/31/16 10/31/17  Earleen Newport, MD  tamsulosin [facility]) 0.4 MG CAPS capsule Take 1 capsule (0.4 mg total) by mouth daily after breakfast. Patient not taking: Reported on 08/22/2017 10/31/16   Earleen Newport, MD  tapentadol (NUCYNTA) 50 MG tablet Take 50 mg by mouth every 4 (four) hours as needed for moderate pain.    [provider]    Physical Exam Vitals: Blood pressure 128/84, height 6' (1.829 m), weight (!) 307 lb (139.3 kg), last menstrual period 08/19/2017.  General: NAD HEENT: normocephalic, anicteric Thyroid: no enlargement, no palpable nodules Pulmonary: No increased work of breathing, CTAB Cardiovascular: RRR, distal pulses 2+ Breast: Breast symmetrical, no tenderness, no palpable nodules or masses, no skin or nipple retraction present, no nipple discharge.  No axillary or supraclavicular lymphadenopathy. Abdomen: NABS, soft, non-tender, non-distended.  Umbilicus without lesions.  No hepatomegaly, splenomegaly or masses palpable. No evidence of hernia  Genitourinary: deferred for PAP screening interval and no concerns Extremities: no edema, erythema, or tenderness Neurologic: Grossly intact Psychiatric: mood appropriate, affect full   Assessment: 36 y.o. G2P2002 routine annual exam  Plan: Problem List Items Addressed This Visit    None    Visit Diagnoses    Well woman exam (no gynecological exam)    -  Primary   Menorrhagia with irregular cycle       Relevant Medications   norgestimate-ethinyl estradiol (ORTHO-CYCLEN,SPRINTEC,PREVIFEM) 0.25-35 MG-MCG tablet      1) STI screening was offered and declined  2) ASCCP guidelines and rational discussed.  Patient opts for every 3 years screening interval  3) Contraception - Patient chooses OCP at this time for birth control and control of heavy period  4) Routine healthcare maintenance including cholesterol, diabetes screening discussed managed by PCP    5) Increase healthy lifestyle diet and exercise  6) Follow up 1 year for routine annual exam  Rod Can, CNM

## 2017-10-05 ENCOUNTER — Emergency Department
Admission: EM | Admit: 2017-10-05 | Discharge: 2017-10-05 | Disposition: A | Discharge status: Home / Self Care | Payer: BC Managed Care – PPO | Attending: Emergency Medicine | Admitting: Emergency Medicine

## 2017-10-05 ENCOUNTER — Emergency Department: Payer: BC Managed Care – PPO

## 2017-10-05 ENCOUNTER — Encounter: Payer: Self-pay | Admitting: Emergency Medicine

## 2017-10-05 DIAGNOSIS — Z79899 Other long term (current) drug therapy: Secondary | ICD-10-CM | POA: Insufficient documentation

## 2017-10-05 DIAGNOSIS — R51 Headache: Secondary | ICD-10-CM | POA: Diagnosis not present

## 2017-10-05 DIAGNOSIS — Z87891 Personal history of nicotine dependence: Secondary | ICD-10-CM | POA: Diagnosis not present

## 2017-10-05 DIAGNOSIS — R519 Headache, unspecified: Secondary | ICD-10-CM

## 2017-10-05 MED ORDER — DIPHENHYDRAMINE HCL 50 MG/ML IJ SOLN
50.0000 mg | Freq: Once | INTRAMUSCULAR | Status: AC
  Administered 2017-10-05: 50 mg via INTRAVENOUS
  Filled 2017-10-05: qty 1

## 2017-10-05 MED ORDER — KETOROLAC TROMETHAMINE 30 MG/ML IJ SOLN
30.0000 mg | Freq: Once | INTRAMUSCULAR | Status: AC
  Administered 2017-10-05: 30 mg via INTRAVENOUS
  Filled 2017-10-05: qty 1

## 2017-10-05 MED ORDER — METOCLOPRAMIDE HCL 5 MG/ML IJ SOLN
10.0000 mg | Freq: Once | INTRAMUSCULAR | Status: AC
  Administered 2017-10-05: 10 mg via INTRAVENOUS
  Filled 2017-10-05: qty 2

## 2017-10-05 MED ORDER — SODIUM CHLORIDE 0.9 % IV BOLUS (SEPSIS)
1000.0000 mL | Freq: Once | INTRAVENOUS | Status: AC
  Administered 2017-10-05: 1000 mL via INTRAVENOUS

## 2017-10-05 NOTE — ED Triage Notes (Signed)
Pt to ed with c/o "not feeling well" since Thursday,  Headache today with nausea and vomiting. BP is 193/127

## 2017-10-05 NOTE — ED Notes (Signed)
Dr. Kerman Passey at bedside at this time.

## 2017-10-05 NOTE — ED Notes (Signed)
NAD noted at time of D/C. Pt taken to lobby via wheelchair by her husband and son. MD aware of pt's BP at time of D/C. Pt instructed to follow up with PCP regarding BP. Pt states understanding. Pt denies any comments/concerns regarding D/C instructions.

## 2017-10-05 NOTE — Discharge Instructions (Signed)
As we discussed please use Tylenol or ibuprofen as needed for any further headache. Please obtain plenty of rest. Please return to the emergency department for any significant worsening of headache, any weakness, numbness, confusion, slurred speech or fever development. Otherwise please follow-up with your doctor Monday for recheck/reevaluation.

## 2017-10-05 NOTE — ED Notes (Signed)
Pt states that she is feeling better at this time. Pt noted to continue to be hypertensive. Will continue to monitor for further patient needs.

## 2017-10-05 NOTE — ED Provider Notes (Signed)
[facility] Emergency Department Provider Note  Time seen: 8:05 PM  I have reviewed the triage vital signs and the nursing notes.   HISTORY  Chief Complaint Headache    HPI Evelyn Estrada is a 36 y.o. female with a past medical history of depression, presents to the emergency department for a headache. According to the patient she had a flu shot on Thursday. States starting Thursday night developed a mild headache with nausea. States over the past 2 days the headache has progressively worsened and is somewhat severe currently. Denies any fever. Patient does state she has had some chills and body aches at home. States nausea, with spitting up. Patient states a history of high blood pressure in the past which has lead to headaches. She thought her blood pressure could be high which it was. Patient came to the emergency department for evaluation.  Past Medical History:  Diagnosis Date  . Chronic joint pain   . Depression   . History of kidney stones    several times    There are no active problems to display for this patient.   Past Surgical History:  Procedure Laterality Date  . EXTRACORPOREAL SHOCK WAVE LITHOTRIPSY Right 11/29/2016   Procedure: EXTRACORPOREAL SHOCK WAVE LITHOTRIPSY (ESWL);  Surgeon: Royston Cowper, MD;  Location: [facility] ORS;  Service: Urology;  Laterality: Right;  . TUBAL LIGATION  2002    Prior to Admission medications   Medication Sig Start Date End Date Taking? Authorizing Provider  escitalopram (LEXAPRO) 10 MG tablet Take 10 mg by mouth daily.    [provider]  ibuprofen (ADVIL,MOTRIN) 200 MG tablet Take 600 mg by mouth every 6 (six) hours as needed for moderate pain.    [provider]  norgestimate-ethinyl estradiol (ORTHO-CYCLEN,SPRINTEC,PREVIFEM) 0.25-35 MG-MCG tablet Take 1 tablet by mouth daily. 08/22/17   Rod Can, CNM  ondansetron (ZOFRAN) 4 MG tablet Take 1 tablet (4 mg total) by mouth daily as  needed for nausea or vomiting. 10/31/16   Earleen Newport, MD  oxyCODONE-acetaminophen (PERCOCET) 7.5-325 MG tablet Take 1 tablet by mouth every 4 (four) hours as needed for severe pain. Patient not taking: Reported on 08/22/2017 10/31/16 10/31/17  Earleen Newport, MD  tamsulosin [facility]) 0.4 MG CAPS capsule Take 1 capsule (0.4 mg total) by mouth daily after breakfast. Patient not taking: Reported on 08/22/2017 10/31/16   Earleen Newport, MD  tapentadol (NUCYNTA) 50 MG tablet Take 50 mg by mouth every 4 (four) hours as needed for moderate pain.    [provider]    Allergies  Allergen Reactions  . Phenergan [Promethazine Hcl]     rash    History reviewed. No pertinent family history.  Social History Social History  Substance Use Topics  . Smoking status: Former Smoker    Types: Cigarettes    Quit date: 12/24/2000  . Smokeless tobacco: Never Used  . Alcohol use No    Review of Systems Constitutional: Negative for fever. Cardiovascular: Negative for chest pain. Respiratory: Negative for shortness of breath. Gastrointestinal: Negative for abdominal pain, vomiting Genitourinary: Negative for dysuria. Musculoskeletal: Negative for neck pain. Neurological: Moderate headache without focal weakness or numbness. All other ROS negative  ____________________________________________   PHYSICAL EXAM:  VITAL SIGNS: ED Triage Vitals  Enc Vitals Group     BP 10/05/17 1832 (!) 179/117     Pulse Rate 10/05/17 1832 96     Resp 10/05/17 1832 16     Temp 10/05/17 1832 98.1  F (36.7 C)     Temp Source 10/05/17 1832 Oral     SpO2 10/05/17 1832 98 %     Weight 10/05/17 1833 (!) 307 lb (139.3 kg)     Height --      Head Circumference --      Peak Flow --      Pain Score 10/05/17 1832 10     Pain Loc --      Pain Edu? --      Excl. in [place]? --     Constitutional: Alert and oriented. Well appearing and in no distress. Eyes: Normal exam, No appreciable photophobia on  exam. EOMI, [place]. ENT   Head: Normocephalic and atraumatic.   Mouth/Throat: Mucous membranes are moist. Cardiovascular: Normal rate, regular rhythm. No murmur Respiratory: Normal respiratory effort without tachypnea nor retractions. Breath sounds are clear Gastrointestinal: Soft and nontender. No distention.   Musculoskeletal: Nontender with normal range of motion in all extremities.  Neurologic:  Normal speech and language. No gross focal neurologic deficits. Equal grip strength. No pronator drift. 5/5 motor in all extremities, cranial nerves intact.  Skin:  Skin is warm, dry and intact.  Psychiatric: Mood and affect are normal.  ____________________________________________   RADIOLOGY  CT normal  ____________________________________________   INITIAL IMPRESSION / ASSESSMENT AND PLAN / ED COURSE  Pertinent labs & imaging results that were available during my care of the patient were reviewed by me and considered in my medical decision making (see chart for details).  Patient presents to the emergency department with a headache ongoing for the past 2-3 days progressively worsening. Differential this time would include migraine headache, tension headache, less likely ICH. Patient's CT scan is negative. Patient has a normal neurological exam. We will treat the patient with Toradol, Reglan, Benadryl, IV fluids and closely monitor. Overall the patient appears extremely well.  ____________________________________________   FINAL CLINICAL IMPRESSION(S) / ED DIAGNOSES  Headache    Harvest Dark, MD 10/08/17 2253

## 2018-06-27 ENCOUNTER — Encounter (HOSPITAL_COMMUNITY): Payer: Self-pay

## 2018-06-27 ENCOUNTER — Other Ambulatory Visit: Payer: Self-pay

## 2018-06-27 ENCOUNTER — Emergency Department (HOSPITAL_COMMUNITY)
Admission: EM | Admit: 2018-06-27 | Discharge: 2018-06-27 | Disposition: A | Discharge status: Home / Self Care | Payer: BC Managed Care – PPO | Attending: Emergency Medicine | Admitting: Emergency Medicine

## 2018-06-27 ENCOUNTER — Emergency Department (HOSPITAL_COMMUNITY): Payer: BC Managed Care – PPO

## 2018-06-27 DIAGNOSIS — Z79899 Other long term (current) drug therapy: Secondary | ICD-10-CM | POA: Insufficient documentation

## 2018-06-27 DIAGNOSIS — X501XXA Overexertion from prolonged static or awkward postures, initial encounter: Secondary | ICD-10-CM | POA: Diagnosis not present

## 2018-06-27 DIAGNOSIS — Y999 Unspecified external cause status: Secondary | ICD-10-CM | POA: Diagnosis not present

## 2018-06-27 DIAGNOSIS — S93402A Sprain of unspecified ligament of left ankle, initial encounter: Secondary | ICD-10-CM | POA: Diagnosis not present

## 2018-06-27 DIAGNOSIS — Y939 Activity, unspecified: Secondary | ICD-10-CM | POA: Insufficient documentation

## 2018-06-27 DIAGNOSIS — Z87891 Personal history of nicotine dependence: Secondary | ICD-10-CM | POA: Diagnosis not present

## 2018-06-27 DIAGNOSIS — Y929 Unspecified place or not applicable: Secondary | ICD-10-CM | POA: Insufficient documentation

## 2018-06-27 DIAGNOSIS — I10 Essential (primary) hypertension: Secondary | ICD-10-CM | POA: Insufficient documentation

## 2018-06-27 HISTORY — DX: Essential (primary) hypertension: I10

## 2018-06-27 NOTE — ED Provider Notes (Signed)
[facility] [place] EMERGENCY DEPARTMENT Provider Note   CSN: [id number] Arrival date & time: 06/27/18  1631     History   Chief Complaint Chief Complaint  Patient presents with  . Ankle Pain    HPI Evelyn Estrada is a 37 y.o. female who presents to the ED with left ankle pain. Patient reports she had to bring her son here last night and when going out she turned her left ankle. She states she has done something similar before but by the next day is is usually ok. Today she continues to have pain in the ankle.   HPI  Past Medical History:  Diagnosis Date  . Chronic joint pain   . Depression   . History of kidney stones    several times  . Hypertension     There are no active problems to display for this patient.   Past Surgical History:  Procedure Laterality Date  . EXTRACORPOREAL SHOCK WAVE LITHOTRIPSY Right 11/29/2016   Procedure: EXTRACORPOREAL SHOCK WAVE LITHOTRIPSY (ESWL);  Surgeon: Royston Cowper, MD;  Location: [facility] ORS;  Service: Urology;  Laterality: Right;  . TUBAL LIGATION  2002     OB History    Gravida  2   Para  2   Term  2   Preterm      AB      Living  2     SAB      TAB      Ectopic      Multiple      Live Births  2            Home Medications    Prior to Admission medications   Medication Sig Start Date End Date Taking? Authorizing Provider  escitalopram (LEXAPRO) 10 MG tablet Take 10 mg by mouth daily.    [provider]  ibuprofen (ADVIL,MOTRIN) 200 MG tablet Take 600 mg by mouth every 6 (six) hours as needed for moderate pain.    [provider]  norgestimate-ethinyl estradiol (ORTHO-CYCLEN,SPRINTEC,PREVIFEM) 0.25-35 MG-MCG tablet Take 1 tablet by mouth daily. 08/22/17   Rod Can, CNM  ondansetron (ZOFRAN) 4 MG tablet Take 1 tablet (4 mg total) by mouth daily as needed for nausea or vomiting. 10/31/16   Earleen Newport, MD  tamsulosin (FLOMAX) 0.4 MG CAPS capsule Take 1 capsule  (0.4 mg total) by mouth daily after breakfast. Patient not taking: Reported on 08/22/2017 10/31/16   Earleen Newport, MD  tapentadol (NUCYNTA) 50 MG tablet Take 50 mg by mouth every 4 (four) hours as needed for moderate pain.    [provider]    Family History No family history on file.  Social History Social History   Tobacco Use  . Smoking status: Former Smoker    Types: Cigarettes    Last attempt to quit: 12/24/2000    Years since quitting: 17.5  . Smokeless tobacco: Never Used  Substance Use Topics  . Alcohol use: No  . Drug use: No     Allergies   Phenergan [promethazine hcl]   Review of Systems Review of Systems  Musculoskeletal: Positive for arthralgias.       Left ankle pain  All other systems reviewed and are negative.    Physical Exam Updated Vital Signs Ht 6\' 1"  (1.854 m)   Wt (!) 145.2 kg (320 lb)   LMP 05/28/2018   BMI 42.22 kg/m   Physical Exam  Constitutional: She appears well-developed and well-nourished. No distress.  HENT:  Head: Normocephalic.  Eyes: EOM are normal.  Neck: Neck supple.  Cardiovascular: Normal rate.  Pulmonary/Chest: Effort normal.  Abdominal: Soft. There is no tenderness.  Musculoskeletal:       Left ankle: She exhibits swelling. She exhibits no deformity, no laceration and normal pulse. Decreased range of motion: due to pain. Tenderness. Lateral malleolus tenderness found. Achilles tendon normal.  Skin: Skin is warm and dry.  Psychiatric: She has a normal mood and affect.  Nursing note and vitals reviewed.    ED Treatments / Results  Labs (all labs ordered are listed, but only abnormal results are displayed) Labs Reviewed - No data to display  Radiology Dg Ankle Complete Left  Result Date: 06/27/2018 CLINICAL DATA:  Twisted LEFT ankle, pain and swelling, twist injury, no relief with icing EXAM: LEFT ANKLE COMPLETE - 3+ VIEW COMPARISON:  None FINDINGS: Soft tissue swelling greatest medially. Osseous  mineralization normal. Ankle joint space preserved. No acute fracture, dislocation, or bone destruction. Small plantar calcaneal spur. IMPRESSION: No acute osseous abnormalities.  At Electronically Signed   By: Lavonia Dana M.D.   On: 06/27/2018 17:08    Procedures Procedures (including critical care time)  Medications Ordered in ED Medications - No data to display   Initial Impression / Assessment and Plan / ED Course  I have reviewed the triage vital signs and the nursing notes. 37 y.o. female with left ankle pain s/p injury last night stable for d/c without focal neuro deficits. ASO, crutches, ice, elevation and f/u with PCP. Patient to take tylenol and Advil as needed for pain.  Final Clinical Impressions(s) / ED Diagnoses   Final diagnoses:  Sprain of left ankle, unspecified ligament, initial encounter    ED Discharge Orders    None       Debroah Baller [place], [place] 06/27/18 1734    Elnora Morrison, MD 06/28/18 0010

## 2018-06-27 NOTE — Discharge Instructions (Addendum)
Take tylenol and Advil as needed for pain. Follow up with your doctor. Return here as needed.

## 2018-06-27 NOTE — ED Provider Notes (Signed)
Patient placed in Quick Look pathway, seen and evaluated   Chief Complaint: ankle pain left  HPI:   Evelyn Estrada is a 37 y.o. female who presents to the ED with left ankle pain that started last night. Patient reports she was here with her son last night and and turned her ankle.  ROS: M/S: left ankle pain  Physical Exam:   Gen: No distress  Neuro: Awake and Alert  Skin: Warm and dry  M/S: left ankle tender with ROM, pedal pulse present, adequate circulation.       Initiation of care has begun. The patient has been counseled on the process, plan, and necessity for staying for the completion/evaluation, and the remainder of the medical screening examination    Ashley Murrain, NP 06/29/18 6015    Elnora Morrison, MD 06/29/18 [phone]

## 2018-06-27 NOTE — ED Triage Notes (Signed)
Pt states that she tripped and twisted her left ankle, c/o continued ankle pain today.

## 2018-08-04 ENCOUNTER — Other Ambulatory Visit: Payer: Self-pay

## 2018-08-04 DIAGNOSIS — N921 Excessive and frequent menstruation with irregular cycle: Secondary | ICD-10-CM

## 2018-08-04 MED ORDER — NORGESTIMATE-ETH ESTRADIOL 0.25-35 MG-MCG PO TABS: 1.0000 | ORAL_TABLET | Freq: Every day | ORAL | 1 refills | Status: DC

## 2018-08-04 NOTE — Telephone Encounter (Signed)
Pt has annual scheduled for 08/28/18 and needs refill of bcp before then.  [facility] [employer].  [phone]  Pt aware refilled eRx'd.

## 2018-08-28 ENCOUNTER — Ambulatory Visit: Payer: BC Managed Care – PPO | Admitting: Advanced Practice Midwife

## 2018-09-01 ENCOUNTER — Other Ambulatory Visit: Payer: Self-pay

## 2018-09-01 ENCOUNTER — Encounter: Payer: Self-pay | Admitting: Emergency Medicine

## 2018-09-01 DIAGNOSIS — R109 Unspecified abdominal pain: Secondary | ICD-10-CM | POA: Diagnosis present

## 2018-09-01 DIAGNOSIS — N201 Calculus of ureter: Secondary | ICD-10-CM | POA: Diagnosis not present

## 2018-09-01 DIAGNOSIS — Z79899 Other long term (current) drug therapy: Secondary | ICD-10-CM | POA: Diagnosis not present

## 2018-09-01 DIAGNOSIS — Z87891 Personal history of nicotine dependence: Secondary | ICD-10-CM | POA: Insufficient documentation

## 2018-09-01 DIAGNOSIS — I10 Essential (primary) hypertension: Secondary | ICD-10-CM | POA: Insufficient documentation

## 2018-09-01 LAB — CBC
HEMATOCRIT: 38.6 % (ref 35.0–47.0)
HEMOGLOBIN: 13.3 g/dL (ref 12.0–16.0)
MCH: 31.2 pg (ref 26.0–34.0)
MCHC: 34.4 g/dL (ref 32.0–36.0)
MCV: 90.7 fL (ref 80.0–100.0)
Platelets: 360 10*3/uL (ref 150–440)
RBC: 4.26 MIL/uL (ref 3.80–5.20)
RDW: 14.1 % (ref 11.5–14.5)
WBC: 9.8 10*3/uL (ref 3.6–11.0)

## 2018-09-01 LAB — BASIC METABOLIC PANEL
ANION GAP: 9 (ref 5–15)
BUN: 16 mg/dL (ref 6–20)
CALCIUM: 8.9 mg/dL (ref 8.9–10.3)
CO2: 24 mmol/L (ref 22–32)
Chloride: 105 mmol/L (ref 98–111)
Creatinine, Ser: 0.77 mg/dL (ref 0.44–1.00)
GFR calc Af Amer: >60 mL/min (ref >60)
Glucose, Bld: 99 mg/dL (ref 70–99)
POTASSIUM: 3.4 mmol/L — AB (ref 3.5–5.1)
SODIUM: 138 mmol/L (ref 135–145)

## 2018-09-01 LAB — URINALYSIS, COMPLETE (UACMP) WITH MICROSCOPIC
Bilirubin Urine: NEGATIVE
Glucose, UA: NEGATIVE mg/dL
KETONES UR: NEGATIVE mg/dL
Nitrite: NEGATIVE
PROTEIN: 30 mg/dL — AB
RBC / HPF: >50 RBC/hpf — ABNORMAL HIGH (ref 0–5)
Specific Gravity, Urine: 1.02 (ref 1.005–1.030)
pH: 5 (ref 5.0–8.0)

## 2018-09-01 LAB — POCT PREGNANCY, URINE: PREG TEST UR: NEGATIVE

## 2018-09-01 MED ORDER — ONDANSETRON HCL 4 MG/2ML IJ SOLN
4.0000 mg | Freq: Once | INTRAMUSCULAR | Status: AC
  Administered 2018-09-01: 4 mg via INTRAVENOUS
  Filled 2018-09-01: qty 2

## 2018-09-01 MED ORDER — FENTANYL CITRATE (PF) 100 MCG/2ML IJ SOLN
50.0000 ug | INTRAMUSCULAR | Status: DC | PRN
  Administered 2018-09-01: 50 ug via INTRAVENOUS
  Filled 2018-09-01: qty 2

## 2018-09-01 NOTE — ED Triage Notes (Signed)
Pt in via POV with complaints of right flank pain x one day, w/ some N/V, denies any urinary symptoms, reports hx of kidney stones.  Pt afebrile at this time.

## 2018-09-02 ENCOUNTER — Emergency Department
Admission: EM | Admit: 2018-09-02 | Discharge: 2018-09-02 | Disposition: A | Discharge status: Home / Self Care | Payer: BC Managed Care – PPO | Attending: Emergency Medicine | Admitting: Emergency Medicine

## 2018-09-02 ENCOUNTER — Emergency Department: Payer: BC Managed Care – PPO

## 2018-09-02 DIAGNOSIS — N201 Calculus of ureter: Secondary | ICD-10-CM

## 2018-09-02 MED ORDER — TAMSULOSIN HCL 0.4 MG PO CAPS: ORAL_CAPSULE | ORAL | 0 refills | Status: DC

## 2018-09-02 MED ORDER — DOCUSATE SODIUM 100 MG PO CAPS: ORAL_CAPSULE | ORAL | 0 refills | Status: DC

## 2018-09-02 MED ORDER — OXYCODONE-ACETAMINOPHEN 5-325 MG PO TABS: 1.0000 | ORAL_TABLET | Freq: Four times a day (QID) | ORAL | 0 refills | Status: DC | PRN

## 2018-09-02 MED ORDER — ONDANSETRON 4 MG PO TBDP: ORAL_TABLET | ORAL | 0 refills | Status: DC

## 2018-09-02 MED ORDER — ONDANSETRON HCL 4 MG/2ML IJ SOLN
4.0000 mg | INTRAMUSCULAR | Status: AC
  Administered 2018-09-02: 4 mg via INTRAVENOUS
  Filled 2018-09-02: qty 2

## 2018-09-02 MED ORDER — KETOROLAC TROMETHAMINE 30 MG/ML IJ SOLN
15.0000 mg | Freq: Once | INTRAMUSCULAR | Status: AC
  Administered 2018-09-02: 15 mg via INTRAVENOUS
  Filled 2018-09-02: qty 1

## 2018-09-02 MED ORDER — MORPHINE SULFATE (PF) 4 MG/ML IV SOLN
4.0000 mg | Freq: Once | INTRAVENOUS | Status: AC
  Administered 2018-09-02: 4 mg via INTRAVENOUS
  Filled 2018-09-02: qty 1

## 2018-09-02 NOTE — Discharge Instructions (Signed)
You have been seen in the Emergency Department (ED) today for pain that we believe based on your workup, is caused by kidney stones.  As we have discussed, please drink plenty of fluids.  Please make a follow up appointment with the physician(s) listed elsewhere in this documentation. ° °You may take pain medication as needed but ONLY as prescribed.  Please also take your prescribed Flomax daily.  We also recommend that you take over-the-counter ibuprofen regularly according to label instructions over the next 5 days.  Take it with meals to minimize stomach discomfort. ° °Please see your doctor as soon as possible as stones may take 1-3 weeks to pass and you may require additional care or medications. ° °Do not drink alcohol, drive or participate in any other potentially dangerous activities while taking opiate pain medication as it may make you sleepy. Do not take this medication with any other sedating medications, either prescription or over-the-counter. If you were prescribed Percocet or Vicodin, do not take these with acetaminophen (Tylenol) as it is already contained within these medications. °  °Take Percocet as needed for severe pain.  This medication is an opiate (or narcotic) pain medication and can be habit forming.  Use it as little as possible to achieve adequate pain control.  Do not use or use it with extreme caution if you have a history of opiate abuse or dependence.  If you are on a pain contract with your primary care doctor or a pain specialist, be sure to let them know you were prescribed this medication today from the [facility].  This medication is intended for your use only - do not give any to anyone else and keep it in a secure place where nobody else, especially children, have access to it.  It will also cause or worsen constipation, so you may want to consider taking an over-the-counter stool softener while you are taking this medication. ° °Return to the  Emergency Department (ED) or call your doctor if you have any worsening pain, fever, painful urination, are unable to urinate, or develop other symptoms that concern you. ° °

## 2018-09-02 NOTE — ED Notes (Signed)
Pt states that right sided flank pain started this morning. She took Tylenol for the pain with no relief. At first she thought it may be a pulled muscle but as the day went on and pain was worse she was convinced it my be a kidney stone type of pain. Family at bedside. PT alert and oriented x 4.

## 2018-09-02 NOTE — ED Provider Notes (Signed)
[facility] Emergency Department Provider Note  ____________________________________________   First MD Initiated Contact with Patient 09/02/18 [phone]     (approximate)  I have reviewed the triage vital signs and the nursing notes.   HISTORY  Chief Complaint Flank Pain    HPI [name] is a 37 y.o. female with medical history as listed below which notably includes for prior episodes of kidney stones.  She presents tonight for acute onset of right flank pain radiating down to the right groin which started at about 11:00 this morning and has persisted during the day.  It waxes and wanes between mild and severe and is a sharp stabbing pain with intermittent aching.  She has had nausea but no vomiting.  She denies fever/chills, dysuria, chest pain, shortness of breath.  It feels similar to prior kidney stones.  She is not certain if she has had any hematuria recently because she is just finishing her period.  Past Medical History:  Diagnosis Date  . Chronic joint pain   . Depression   . History of kidney stones    several times  . Hypertension     There are no active problems to display for this patient.   Past Surgical History:  Procedure Laterality Date  . EXTRACORPOREAL SHOCK WAVE LITHOTRIPSY Right 11/29/2016   Procedure: EXTRACORPOREAL SHOCK WAVE LITHOTRIPSY (ESWL);  Surgeon: Royston Cowper, MD;  Location: [facility] ORS;  Service: Urology;  Laterality: Right;  . TUBAL LIGATION  2002    Prior to Admission medications   Medication Sig Start Date End Date Taking? Authorizing Provider  docusate sodium (COLACE) 100 MG capsule Take 1 tablet once or twice daily as needed for constipation while taking narcotic pain medicine 09/02/18   Hinda Kehr, MD  escitalopram (LEXAPRO) 10 MG tablet Take 10 mg by mouth daily.    [provider]  ibuprofen (ADVIL,MOTRIN) 200 MG tablet Take 600 mg by mouth every 6 (six) hours as needed for moderate pain.     [provider]  norgestimate-ethinyl estradiol (ORTHO-CYCLEN,SPRINTEC,PREVIFEM) 0.25-35 MG-MCG tablet Take 1 tablet by mouth daily. 08/04/18   Rod Can, CNM  ondansetron (ZOFRAN ODT) 4 MG disintegrating tablet Allow 1-2 tablets to dissolve in your mouth every 8 hours as needed for nausea/vomiting 09/02/18   Hinda Kehr, MD  ondansetron (ZOFRAN) 4 MG tablet Take 1 tablet (4 mg total) by mouth daily as needed for nausea or vomiting. 10/31/16   Earleen Newport, MD  oxyCODONE-acetaminophen (PERCOCET) 5-325 MG tablet Take 1-2 tablets by mouth every 6 (six) hours as needed for severe pain. 09/02/18   Hinda Kehr, MD  tamsulosin (FLOMAX) 0.4 MG CAPS capsule Take 1 tablet by mouth daily until you pass the kidney stone or no longer have symptoms 09/02/18   Hinda Kehr, MD  tapentadol (NUCYNTA) 50 MG tablet Take 50 mg by mouth every 4 (four) hours as needed for moderate pain.    [provider]    Allergies Phenergan [promethazine hcl]  No family history on file.  Social History Social History   Tobacco Use  . Smoking status: Former Smoker    Types: Cigarettes    Last attempt to quit: 12/24/2000    Years since quitting: 17.7  . Smokeless tobacco: Never Used  Substance Use Topics  . Alcohol use: No  . Drug use: No    Review of Systems Constitutional: No fever/chills Eyes: No visual changes. ENT: No sore throat. Cardiovascular: Denies chest pain. Respiratory: Denies shortness of  breath. Gastrointestinal: Nausea and right-sided flank and abdominal pain as described above. Genitourinary: Negative for dysuria.  Possible hematuria. Musculoskeletal: Negative for neck pain.  Right-sided flank pain as described above Integumentary: Negative for rash. Neurological: Negative for headaches, focal weakness or numbness.   ____________________________________________   PHYSICAL EXAM:  VITAL SIGNS: ED Triage Vitals  Enc Vitals Group     BP 09/01/18 2232 (!) 151/111      Pulse Rate 09/01/18 2232 84     Resp 09/01/18 2232 16     Temp 09/01/18 2232 (!) 97.5 F (36.4 C)     Temp Source 09/01/18 2232 Oral     SpO2 09/01/18 2232 98 %     Weight 09/01/18 2233 136.1 kg (300 lb)     Height 09/01/18 2233 1.829 m (6')     Head Circumference --      Peak Flow --      Pain Score 09/01/18 2233 8     Pain Loc --      Pain Edu? --      Excl. in [place]? --     Constitutional: Alert and oriented.  Generally well-appearing but does appear uncomfortable Eyes: Conjunctivae are normal.  Head: Atraumatic. Nose: No congestion/rhinnorhea. Mouth/Throat: Mucous membranes are moist. Neck: No stridor.  No meningeal signs.   Cardiovascular: Normal rate, regular rhythm. Good peripheral circulation. Grossly normal heart sounds. Respiratory: Normal respiratory effort.  No retractions. Lungs CTAB. Gastrointestinal: Soft and nontender. No distention.  Musculoskeletal: No lower extremity tenderness nor edema. No gross deformities of extremities.  Right CVA tenderness to percussion. Neurologic:  Normal speech and language. No gross focal neurologic deficits are appreciated.  Skin:  Skin is warm, dry and intact. No rash noted. Psychiatric: Mood and affect are normal. Speech and behavior are normal.  ____________________________________________   LABS (all labs ordered are listed, but only abnormal results are displayed)  Labs Reviewed  URINALYSIS, COMPLETE (UACMP) WITH MICROSCOPIC - Abnormal; Notable for the following components:      Result Value   Color, Urine YELLOW (*)    APPearance HAZY (*)    Hgb urine dipstick LARGE (*)    Protein, ur 30 (*)    Leukocytes, UA TRACE (*)    RBC / HPF >50 (*)    Bacteria, UA RARE (*)    All other components within normal limits  BASIC METABOLIC PANEL - Abnormal; Notable for the following components:   Potassium 3.4 (*)    All other components within normal limits  URINE CULTURE  CBC  POC URINE PREG, ED  POCT PREGNANCY, URINE    ____________________________________________  EKG  None - EKG not ordered by ED physician ____________________________________________  RADIOLOGY   ED MD interpretation:  5-mm stone either within the bladder or very distal ureter  Official radiology report(s): Ct Renal Stone Study  Result Date: 09/02/2018 CLINICAL DATA:  37 y/o  F; right-sided flank pain. EXAM: CT ABDOMEN AND PELVIS WITHOUT CONTRAST TECHNIQUE: Multidetector CT imaging of the abdomen and pelvis was performed following the standard protocol without IV contrast. COMPARISON:  None. FINDINGS: Lower chest: No acute abnormality. Hepatobiliary: Hepatic steatosis. No focal liver abnormality is seen. No gallstones, gallbladder wall thickening, or biliary dilatation. Pancreas: Unremarkable. No pancreatic ductal dilatation or surrounding inflammatory changes. Spleen: Normal in size without focal abnormality. Adrenals/Urinary Tract: Stone within the right posterior bladder measuring up to 5 mm (series 5, image 98 and series 2, image 101) which may be within the bladder lumen or intramural right  UVJ. Negative adrenal glands. No focal kidney lesion or urinary stone identified. No hydronephrosis. Stomach/Bowel: Stomach is within normal limits. Appendix appears normal. No evidence of bowel wall thickening, distention, or inflammatory changes. Mild sigmoid diverticulosis, no findings of acute diverticulitis. Vascular/Lymphatic: No significant vascular findings are present. No enlarged abdominal or pelvic lymph nodes. Reproductive: Uterus and bilateral adnexa are unremarkable. Other: No abdominal wall hernia or abnormality. No abdominopelvic ascites. Musculoskeletal: No acute or significant osseous findings. IMPRESSION: 1. 5 mm stone in the right posterior bladder which may be within the bladder lumen or intramural right ureterovesicular junction. No hydronephrosis. 2. Hepatic steatosis. 3. Mild sigmoid diverticulosis, no findings of acute  diverticulitis. Electronically Signed   By: Kristine Garbe M.D.   On: 09/02/2018 02:24    ____________________________________________   PROCEDURES  Critical Care performed: No   Procedure(s) performed:   Procedures   ____________________________________________   INITIAL IMPRESSION / ASSESSMENT AND PLAN / ED COURSE  As part of my medical decision making, I reviewed the following data within the [facility] notes reviewed and incorporated, Labs reviewed , Old chart reviewed and Notes from prior ED visits    Differential diagnosis includes, but is not limited to, recurrent kidney stones, infected kidney stone, musculoskeletal pain/strain, appendicitis, biliary colic, renal infarction much less likely.  The patient does have some hematuria but is also coming off of her menstrual cycle.  Vital signs are stable other than hypertension likely attributable to her pain.  She does appear uncomfortable at this time and I am ordering morphine 4 mill grams IV, Zofran 4 mg IV, Toradol 15 mg IV, and we are obtaining a CT renal study protocol for further evaluation given that the last time she had a kidney stone she required nephrolithiasis.  I had my usual customary discussion about CT scan versus radiograph and she and I both feel that obtaining the CT would be the most beneficial move at this point.   Clinical Course as of Sep 02 621  Tue Sep 02, 2018  0236 The patient is feeling much better and in fact was asleep.  When she awoke she said that her pain is almost completely gone. Updated her about the CT results showing a 5-mm stone which is almost in the bladder (or possibly already entered the bladder).  Patient comfortable with plan to follow up with Dr. Rogers Blocker as outpatient.  I gave my usual and customary return precautions.   [CF]    Clinical Course User Index [CF] Hinda Kehr, MD    ____________________________________________  FINAL CLINICAL  IMPRESSION(S) / ED DIAGNOSES  Final diagnoses:  Right ureteral stone     MEDICATIONS GIVEN DURING THIS VISIT:  Medications  ondansetron (ZOFRAN) injection 4 mg (4 mg Intravenous Given 09/01/18 2248)  morphine 4 MG/ML injection 4 mg (4 mg Intravenous Given 09/02/18 0114)  ondansetron (ZOFRAN) injection 4 mg (4 mg Intravenous Given 09/02/18 0113)  ketorolac (TORADOL) 30 MG/ML injection 15 mg (15 mg Intravenous Given 09/02/18 0114)     ED Discharge Orders         Ordered    oxyCODONE-acetaminophen (PERCOCET) 5-325 MG tablet  Every 6 hours PRN     09/02/18 0243    ondansetron (ZOFRAN ODT) 4 MG disintegrating tablet     09/02/18 0243    tamsulosin (FLOMAX) 0.4 MG CAPS capsule     09/02/18 0243    docusate sodium (COLACE) 100 MG capsule     09/02/18 0243  Note:  This document was prepared using Dragon voice recognition software and may include unintentional dictation errors.    Hinda Kehr, MD 09/02/18 [phone]

## 2018-09-04 LAB — URINE CULTURE: SPECIAL REQUESTS: NORMAL

## 2018-09-05 NOTE — Progress Notes (Signed)
ED Antimicrobial Stewardship Positive Culture Follow Up   Evelyn Estrada is an 37 y.o. female who presented to [facility] on 09/02/2018 with a chief complaint of Flank pain.  CT results showed a 5-mm stone which the patient stated they have since passed.  Patient denied fever since admission.  Patient stated she has since been experiencing frequent urination.  Will call in approved Keflex to [facility].     Chief Complaint  Patient presents with  . Flank Pain           Recent Results (from the past 720 hour(s))  Urine Culture     Status: Abnormal   Collection Time: 09/01/18 10:42 PM  Result Value Ref Range Status   Specimen Description   Final    URINE, CLEAN CATCH Performed at [facility], [address] [place], [place] [address]    Special Requests   Final    Normal Performed at [facility], [place], [place] [address]    Culture >=100,000 COLONIES/mL KLEBSIELLA PNEUMONIAE (A)  Final   Report Status 09/04/2018 FINAL  Final   Organism ID, Bacteria KLEBSIELLA PNEUMONIAE (A)  Final      Susceptibility   Klebsiella pneumoniae - MIC*    AMPICILLIN >=32 RESISTANT Resistant     CEFAZOLIN <=4 SENSITIVE Sensitive     CEFTRIAXONE <=1 SENSITIVE Sensitive     CIPROFLOXACIN <=0.25 SENSITIVE Sensitive     GENTAMICIN <=1 SENSITIVE Sensitive     IMIPENEM <=0.25 SENSITIVE Sensitive     NITROFURANTOIN 64 INTERMEDIATE Intermediate     TRIMETH/SULFA <=20 SENSITIVE Sensitive     AMPICILLIN/SULBACTAM <=2 SENSITIVE Sensitive     PIP/TAZO <=4 SENSITIVE Sensitive     Extended ESBL NEGATIVE Sensitive     * >=100,000 COLONIES/mL KLEBSIELLA PNEUMONIAE    []  Treated with , organism resistant to prescribed antimicrobial [x]  Patient discharged originally without antimicrobial agent and treatment is now indicated.  New antibiotic prescription: Keflex 500 mg twice a day for 7 days.  ED  Provider: Dr. Nigel Mormon

## 2020-01-12 ENCOUNTER — Encounter: Payer: Self-pay | Admitting: Advanced Practice Midwife

## 2020-01-12 ENCOUNTER — Other Ambulatory Visit: Payer: Self-pay

## 2020-01-12 ENCOUNTER — Ambulatory Visit: Payer: BC Managed Care – PPO | Admitting: Obstetrics and Gynecology

## 2020-01-12 ENCOUNTER — Ambulatory Visit (INDEPENDENT_AMBULATORY_CARE_PROVIDER_SITE_OTHER): Payer: BC Managed Care – PPO | Admitting: Advanced Practice Midwife

## 2020-01-12 ENCOUNTER — Other Ambulatory Visit (HOSPITAL_COMMUNITY)
Admission: RE | Admit: 2020-01-12 | Discharge: 2020-01-12 | Disposition: A | Discharge status: Home / Self Care | Payer: BC Managed Care – PPO | Source: Ambulatory Visit | Attending: Advanced Practice Midwife | Admitting: Advanced Practice Midwife

## 2020-01-12 VITALS — BP 138/84 | Ht 73.0 in | Wt 328.0 lb

## 2020-01-12 DIAGNOSIS — N898 Other specified noninflammatory disorders of vagina: Secondary | ICD-10-CM | POA: Diagnosis not present

## 2020-01-12 DIAGNOSIS — K649 Unspecified hemorrhoids: Secondary | ICD-10-CM

## 2020-01-12 MED ORDER — HYDROCORTISONE ACETATE 25 MG RE SUPP: 25.0000 mg | Freq: Two times a day (BID) | RECTAL | 1 refills | Status: DC

## 2020-01-12 MED ORDER — METRONIDAZOLE 0.75 % VA GEL: 1.0000 | Freq: Every day | VAGINAL | 0 refills | Status: AC

## 2020-01-12 MED ORDER — FLUCONAZOLE 150 MG PO TABS: 150.0000 mg | ORAL_TABLET | Freq: Once | ORAL | 1 refills | Status: AC

## 2020-01-12 NOTE — Progress Notes (Signed)
Patient ID: Evelyn Estrada, female   DOB: Oct 17, 1981, 39 y.o.   MRN: [id number]  Reason for Consult: Vaginitis and Pelvic Pain    Subjective:     HPI:  Evelyn Estrada is a 39 y.o. female being seen for symptoms of vaginitis. About 3 weeks ago she had a kidney stone which she since passed and then developed a UTI. She was treated with antibiotics and just finished taking them a couple days ago. About 1 week ago she developed symptoms of a yeast infection; thick white discharge, itching, burning. She used 3 days of Monistat which seemed to help some. Now she feels generally sore and swollen in her vagina. She denies burning with urination. She has some frequency of urination but is able to empty her bladder. She denies fever, chills, body aches, nausea, vomiting. She denies concern for STDs.  She also has complaint of painful (mainly internally) and occasionally bleeding hemorrhoids. She requests referral to GI for evaluation. We discussed comfort measures and healthy bowel habits.   Past Medical History:  Diagnosis Date  . Chronic joint pain   . Depression   . History of kidney stones    several times  . Hypertension    History reviewed. No pertinent family history. Past Surgical History:  Procedure Laterality Date  . EXTRACORPOREAL SHOCK WAVE LITHOTRIPSY Right 11/29/2016   Procedure: EXTRACORPOREAL SHOCK WAVE LITHOTRIPSY (ESWL);  Surgeon: Royston Cowper, MD;  Location: [facility] ORS;  Service: Urology;  Laterality: Right;  . TUBAL LIGATION  2002    Short Social History:  Social History   Tobacco Use  . Smoking status: Former Smoker    Types: Cigarettes    Quit date: 12/24/2000    Years since quitting: 19.0  . Smokeless tobacco: Never Used  Substance Use Topics  . Alcohol use: No    Allergies  Allergen Reactions  . Phenergan [Promethazine Hcl] Rash    Current Outpatient Medications  Medication Sig Dispense Refill  . docusate sodium (COLACE) 100 MG capsule Take 1  tablet once or twice daily as needed for constipation while taking narcotic pain medicine 30 capsule 0  . escitalopram (LEXAPRO) 10 MG tablet Take 10 mg by mouth daily.    . hydrochlorothiazide (HYDRODIURIL) 25 MG tablet Take 25 mg by mouth daily.    Marland Kitchen ibuprofen (ADVIL,MOTRIN) 200 MG tablet Take 600 mg by mouth every 6 (six) hours as needed for moderate pain.    Marland Kitchen losartan (COZAAR) 50 MG tablet Take 50 mg by mouth daily.    . norgestimate-ethinyl estradiol (ORTHO-CYCLEN,SPRINTEC,PREVIFEM) 0.25-35 MG-MCG tablet Take 1 tablet by mouth daily. 1 Package 1  . fluconazole (DIFLUCAN) 150 MG tablet Take 1 tablet (150 mg total) by mouth once for 1 dose. Can take additional dose three days later if symptoms persist 1 tablet 1  . hydrocortisone (ANUSOL-HC) 25 MG suppository Place 1 suppository (25 mg total) rectally 2 (two) times daily. 12 suppository 1  . hydrOXYzine (ATARAX/VISTARIL) 10 MG tablet Take 10 mg by mouth 3 (three) times daily.    . Meth-Hyo-M Bl-Na Phos-Ph Sal (URIBEL) 118 MG CAPS     . metroNIDAZOLE (METROGEL) 0.75 % vaginal gel Place 1 Applicatorful vaginally at bedtime for 5 days. 70 g 0  . tapentadol (NUCYNTA) 50 MG tablet Take 50 mg by mouth every 4 (four) hours as needed for moderate pain.     No current facility-administered medications for this visit.    Review of Systems  Constitutional: Positive for malaise/fatigue.  HENT: Negative.  Eyes: Negative.   Respiratory: Negative.   Cardiovascular: Negative.   Gastrointestinal: Positive for diarrhea.  Genitourinary: Positive for frequency.       Discharge   Musculoskeletal: Negative.   Skin: Negative.   Neurological: Negative.   Endo/Heme/Allergies: Negative.   Psychiatric/Behavioral: Positive for depression.       Anxiety        Objective:  Objective   Vitals:   01/12/20 1007  BP: 138/84  Weight: (!) 328 lb (148.8 kg)  Height: 6\' 1"  (1.854 m)   Body mass index is 43.27 kg/m. Constitutional: Well nourished, well  developed female in no acute distress.  HEENT: normal Skin: Warm and dry.    Respiratory:  Normal respiratory effort Back: no CVAT Neuro: DTRs 2+, Cranial nerves grossly intact Psych: Alert and Oriented x3. No memory deficits. Normal mood and affect.  MS: normal gait, normal bilateral lower extremity ROM/strength/stability.  Pelvic exam:  is not limited by body habitus EGBUS: within normal limits Vagina: within normal limits and with normal mucosa, scant clear/white discharge, coating of thin white on inside of labia could be residual from monistat vs yeast Cervix: not evaluated Anus: residual external hemorrhoidal skin tags not inflamed. Not evaluated for internal hemorrhoids  Data: Wet Prep: A few clue cells seen, negative for whiff or yeast  Urinalysis unremarkable     Assessment/Plan:    39 yo G2 P70 female with vaginal irritation and hemorrhoidal skin tags  1) mild BV: Rx metro gel, diflucan prescribed in case yeast develops  2) hemorrhoids: Rx anusol suppositories, referral to GI for further evaluation  3) return to clinic PRN and for annual exam   [facility] 01/12/2020, 12:00 PM

## 2020-01-12 NOTE — Patient Instructions (Signed)
Vaginitis Vaginitis is a condition in which the vaginal tissue swells and becomes red (inflamed). This condition is most often caused by a change in the normal balance of bacteria and yeast that live in the vagina. This change causes an overgrowth of certain bacteria or yeast, which causes the inflammation. There are different types of vaginitis, but the most common types are:  Bacterial vaginosis.  Yeast infection (candidiasis).  Trichomoniasis vaginitis. This is a sexually transmitted disease (STD).  Viral vaginitis.  Atrophic vaginitis.  Allergic vaginitis. What are the causes? The cause of this condition depends on the type of vaginitis. It can be caused by:  Bacteria (bacterial vaginosis).  Yeast, which is a fungus (yeast infection).  A parasite (trichomoniasis vaginitis).  A virus (viral vaginitis).  Low hormone levels (atrophic vaginitis). Low hormone levels can occur during pregnancy, breastfeeding, or after menopause.  Irritants, such as bubble baths, scented tampons, and feminine sprays (allergic vaginitis). Other factors can change the normal balance of the yeast and bacteria that live in the vagina. These include:  Antibiotic medicines.  Poor hygiene.  Diaphragms, vaginal sponges, spermicides, birth control pills, and intrauterine devices (IUD).  Sex.  Infection.  Uncontrolled diabetes.  A weakened defense (immune) system. What increases the risk? This condition is more likely to develop in women who:  Smoke.  Use vaginal douches, scented tampons, or scented sanitary pads.  Wear tight-fitting pants.  Wear thong underwear.  Use oral birth control pills or an IUD.  Have sex without a condom.  Have multiple sex partners.  Have an STD.  Frequently use the spermicide nonoxynol-9.  Eat lots of foods high in sugar.  Have uncontrolled diabetes.  Have low estrogen levels.  Have a weakened immune system from an immune disorder or medical  treatment.  Are pregnant or breastfeeding. What are the signs or symptoms? Symptoms vary depending on the cause of the vaginitis. Common symptoms include:  Abnormal vaginal discharge. ? The discharge is white, gray, or yellow with bacterial vaginosis. ? The discharge is thick, white, and cheesy with a yeast infection. ? The discharge is frothy and yellow or greenish with trichomoniasis.  A bad vaginal smell. The smell is fishy with bacterial vaginosis.  Vaginal itching, pain, or swelling.  Sex that is painful.  Pain or burning when urinating. Sometimes there are no symptoms. How is this diagnosed? This condition is diagnosed based on your symptoms and medical history. A physical exam, including a pelvic exam, will also be done. You may also have other tests, including:  Tests to determine the pH level (acidity or alkalinity) of your vagina.  A whiff test, to assess the odor that results when a sample of your vaginal discharge is mixed with a potassium hydroxide solution.  Tests of vaginal fluid. A sample will be examined under a microscope. How is this treated? Treatment varies depending on the type of vaginitis you have. Your treatment may include:  Antibiotic creams or pills to treat bacterial vaginosis and trichomoniasis.  Antifungal medicines, such as vaginal creams or suppositories, to treat a yeast infection.  Medicine to ease discomfort if you have viral vaginitis. Your sexual partner should also be treated.  Estrogen delivered in a cream, pill, suppository, or vaginal ring to treat atrophic vaginitis. If vaginal dryness occurs, lubricants and moisturizing creams may help. You may need to avoid scented soaps, sprays, or douches.  Stopping use of a product that is causing allergic vaginitis. Then using a vaginal cream to treat the symptoms. Follow   these instructions at home: Lifestyle  Keep your genital area clean and dry. Avoid soap, and only rinse the area with  water.  Do not douche or use tampons until your health care provider says it is okay to do so. Use sanitary pads, if needed.  Do not have sex until your health care provider approves. When you can return to sex, practice safe sex and use condoms.  Wipe from front to back. This avoids the spread of bacteria from the rectum to the vagina. General instructions  Take over-the-counter and prescription medicines only as told by your health care provider.  If you were prescribed an antibiotic medicine, take or use it as told by your health care provider. Do not stop taking or using the antibiotic even if you start to feel better.  Keep all follow-up visits as told by your health care provider. This is important. How is this prevented?  Use mild, non-scented products. Do not use things that can irritate the vagina, such as fabric softeners. Avoid the following products if they are scented: ? Feminine sprays. ? Detergents. ? Tampons. ? Feminine hygiene products. ? Soaps or bubble baths.  Let air reach your genital area. ? Wear cotton underwear to reduce moisture buildup. ? Avoid wearing underwear while you sleep. ? Avoid wearing tight pants and underwear or nylons without a cotton panel. ? Avoid wearing thong underwear.  Take off any wet clothing, such as bathing suits, as soon as possible.  Practice safe sex and use condoms. Contact a health care provider if:  You have abdominal pain.  You have a fever.  You have symptoms that last for more than 2-3 days. Get help right away if:  You have a fever and your symptoms suddenly get worse. Summary  Vaginitis is a condition in which the vaginal tissue becomes inflamed.This condition is most often caused by a change in the normal balance of bacteria and yeast that live in the vagina.  Treatment varies depending on the type of vaginitis you have.  Do not douche, use tampons , or have sex until your health care provider approves. When  you can return to sex, practice safe sex and use condoms. This information is not intended to replace advice given to you by your health care provider. Make sure you discuss any questions you have with your health care provider. Document Revised: 11/22/2017 Document Reviewed: 01/15/2017 Elsevier Patient Education  2020 [employer]  

## 2020-01-12 NOTE — Addendum Note (Signed)
Addended by: Rod Can on: 01/12/2020 04:18 PM   Modules accepted: Orders

## 2020-01-14 LAB — CERVICOVAGINAL ANCILLARY ONLY
Bacterial Vaginitis (gardnerella): NEGATIVE
Candida Glabrata: NEGATIVE
Candida Vaginitis: NEGATIVE
Comment: NEGATIVE
Comment: NEGATIVE
Comment: NEGATIVE

## 2020-01-21 ENCOUNTER — Other Ambulatory Visit: Payer: Self-pay

## 2020-01-21 ENCOUNTER — Ambulatory Visit: Payer: BC Managed Care – PPO | Admitting: Gastroenterology

## 2020-01-21 ENCOUNTER — Encounter: Payer: Self-pay | Admitting: Gastroenterology

## 2020-01-21 VITALS — BP 144/95 | HR 90 | Temp 97.6°F | Resp 18 | Ht 73.0 in | Wt 325.8 lb

## 2020-01-21 DIAGNOSIS — K625 Hemorrhage of anus and rectum: Secondary | ICD-10-CM

## 2020-01-21 DIAGNOSIS — K529 Noninfective gastroenteritis and colitis, unspecified: Secondary | ICD-10-CM

## 2020-01-21 DIAGNOSIS — K602 Anal fissure, unspecified: Secondary | ICD-10-CM | POA: Diagnosis not present

## 2020-01-21 NOTE — Progress Notes (Signed)
Cephas Darby, MD [address]  [place]  [place],  [address]  Main: [phone]  Fax: [phone]    Gastroenterology Consultation  Referring Provider:     Rod Can, CNM Primary Care Physician:  [facility] [place] Primary Gastroenterologist:  Dr. Cephas Darby Reason for Consultation: Chronic diarrhea, rectal pain        HPI:   Evelyn Estrada is a 39 y.o. female referred by Dr. Thera Flake [facility]  for consultation & management of chronic diarrhea and rectal pain Rectal pain: Patient reports that for the last few months, she has been having severe rectal discomfort, burning as well as sharp/stabbing pain every time she has a bowel movement.  She is trying Anusol suppository which provides minimal relief only.  She does notice perianal itching, irritation, bright red blood per rectum upon wiping and in the toilet bowl, severe burning and sometimes prolapse of the tissue during a BM.  She has been experiencing the symptoms for a few years, have worsened in the last few months She has been taking ibuprofen and Tylenol 2 times daily rectal pain  Chronic diarrhea: Ongoing for last 2 years, postprandial urgency, watery bowel movements sometimes mixed with blood.  She denies drinking carbonated beverages.  However, does eat high-calorie foods, snacks regularly as well as red meat regularly  NSAIDs: None  Antiplts/Anticoagulants/Anti thrombotics: None  GI Procedures: None She denies family history of GI malignancy  Past Medical History:  Diagnosis Date  . Chronic joint pain   . Depression   . History of kidney stones    several times  . Hypertension     Past Surgical History:  Procedure Laterality Date  . EXTRACORPOREAL SHOCK WAVE LITHOTRIPSY Right 11/29/2016   Procedure: EXTRACORPOREAL SHOCK WAVE LITHOTRIPSY (ESWL);  Surgeon: Royston Cowper, MD;  Location: [facility] ORS;  Service: Urology;  Laterality: Right;  .  TUBAL LIGATION  2002    Current Outpatient Medications:  .  escitalopram (LEXAPRO) 10 MG tablet, Take 10 mg by mouth daily., Disp: , Rfl:  .  hydrochlorothiazide (HYDRODIURIL) 25 MG tablet, Take 25 mg by mouth daily., Disp: , Rfl:  .  hydrocortisone (ANUSOL-HC) 25 MG suppository, Place 1 suppository (25 mg total) rectally 2 (two) times daily., Disp: 12 suppository, Rfl: 1 .  hydrOXYzine (ATARAX/VISTARIL) 10 MG tablet, Take 10 mg by mouth 3 (three) times daily., Disp: , Rfl:  .  ibuprofen (ADVIL,MOTRIN) 200 MG tablet, Take 600 mg by mouth every 6 (six) hours as needed for moderate pain., Disp: , Rfl:  .  losartan (COZAAR) 50 MG tablet, Take 50 mg by mouth daily., Disp: , Rfl:  .  norgestimate-ethinyl estradiol (ORTHO-CYCLEN,SPRINTEC,PREVIFEM) 0.25-35 MG-MCG tablet, Take 1 tablet by mouth daily., Disp: 1 Package, Rfl: 1 .  docusate sodium (COLACE) 100 MG capsule, Take 1 tablet once or twice daily as needed for constipation while taking narcotic pain medicine (Patient not taking: Reported on 01/21/2020), Disp: 30 capsule, Rfl: 0 .  fluconazole (DIFLUCAN) 150 MG tablet, , Disp: , Rfl:  .  Meth-Hyo-M Bl-Na Phos-Ph Sal (URIBEL) 118 MG CAPS, , Disp: , Rfl:  .  tapentadol (NUCYNTA) 50 MG tablet, Take 50 mg by mouth every 4 (four) hours as needed for moderate pain., Disp: , Rfl:    No family history on file.   Social History   Tobacco Use  . Smoking status: Former Smoker    Types: Cigarettes    Quit date: 12/24/2000  Years since quitting: 19.0  . Smokeless tobacco: Never Used  Substance Use Topics  . Alcohol use: No  . Drug use: No    Allergies as of 01/21/2020 - Review Complete 01/21/2020  Allergen Reaction Noted  . Phenergan [promethazine hcl] Rash 10/31/2016    Review of Systems:    All systems reviewed and negative except where noted in HPI.   Physical Exam:  BP (!) 144/95 (BP Location: Left Arm, Patient Position: Sitting, Cuff Size: Large)   Pulse 90   Temp 97.6 F (36.4 C)    Resp 18   Ht 6\' 1"  (1.854 m)   Wt (!) 325 lb 12.8 oz (147.8 kg)   BMI 42.98 kg/m  No LMP recorded.  General:   Alert,  Well-developed, well-nourished, pleasant and cooperative in NAD Head:  Normocephalic and atraumatic. Eyes:  Sclera clear, no icterus.   Conjunctiva pink. Ears:  Normal auditory acuity. Nose:  No deformity, discharge, or lesions. Mouth:  No deformity or lesions,oropharynx pink & moist. Neck:  Supple; no masses or thyromegaly. Lungs:  Respirations even and unlabored.  Clear throughout to auscultation.   No wheezes, crackles, or rhonchi. No acute distress. Heart:  Regular rate and rhythm; no murmurs, clicks, rubs, or gallops. Abdomen:  Normal bowel sounds. Soft, morbidly obese, non-tender and non-distended without masses, No guarding or rebound tenderness.   Rectal: Perianal skin tags, severe tenderness in the anterior and posterior wall of the anal canal associated with anal spasm Msk:  Symmetrical without gross deformities. Good, equal movement & strength bilaterally. Pulses:  Normal pulses noted. Extremities:  No clubbing or edema.  No cyanosis. Neurologic:  Alert and oriented x3;  grossly normal neurologically. Skin:  Intact without significant lesions or rashes. No jaundice. Psych:  Alert and cooperative. Normal mood and affect.  Imaging Studies: Reviewed  Assessment and Plan:   Evelyn Estrada is a 39 y.o. female with morbid obesity, seen in consultation for chronic diarrhea and chronic rectal pain  Rectal pain: Rectal exam consistent with anal fissure.  She probably has symptomatic external hemorrhoids as well Recommend 0.125% nitroglycerin with 5% lidocaine, instructions provided Recommend sitz bath's daily Advised to use witch hazel cream and Tucks pads as needed for burning  Chronic diarrhea: Check celiac serologies, H. pylori IgG, stool studies Recommend endoscopic evaluation if above work-up negative Advised her to adapt healthy eating  habits  Rectal bleeding: Recommend colonoscopy after healing of the anal fissure in 4 to 6 weeks.  Discussed the same with patient and she is agreeable   Follow up in 1 month   Cephas Darby, MD

## 2020-01-22 LAB — H. PYLORI BREATH TEST: H pylori Breath Test: NEGATIVE

## 2020-01-23 LAB — H. PYLORI ANTIBODY, IGG: H. pylori, IgG AbS: 0.22 Index Value (ref 0.00–0.79)

## 2020-01-23 LAB — CELIAC DISEASE PANEL
Endomysial IgA: NEGATIVE
IgA/Immunoglobulin A, Serum: 391 mg/dL — ABNORMAL HIGH (ref 87–352)
Transglutaminase IgA: <2 U/mL (ref 0–3)

## 2020-01-25 ENCOUNTER — Other Ambulatory Visit: Payer: Self-pay | Admitting: Gastroenterology

## 2020-01-28 LAB — GI PROFILE, STOOL, PCR

## 2020-02-12 ENCOUNTER — Encounter: Payer: Self-pay | Admitting: Gastroenterology

## 2020-03-01 ENCOUNTER — Telehealth: Payer: Self-pay

## 2020-03-01 ENCOUNTER — Other Ambulatory Visit: Payer: Self-pay

## 2020-03-01 DIAGNOSIS — K529 Noninfective gastroenteritis and colitis, unspecified: Secondary | ICD-10-CM

## 2020-03-01 DIAGNOSIS — K625 Hemorrhage of anus and rectum: Secondary | ICD-10-CM

## 2020-03-01 MED ORDER — NA SULFATE-K SULFATE-MG SULF 17.5-3.13-1.6 GM/177ML PO SOLN: 354.0000 mL | Freq: Once | ORAL | 0 refills | Status: AC

## 2020-03-01 NOTE — Telephone Encounter (Signed)
Patient states she is doing the cream like she is supposed to. Called in the other cream for patient. Scheduled patient for colonoscopy and sent instruction to mychart and sent prep to the pharmacy

## 2020-03-01 NOTE — Telephone Encounter (Signed)
Evelyn Estrada  Please find out if she is applying nitro 3-4 times daily.  If yes, let us switch to 0.3% nifedipine with 5% lidocaine.  Also, please schedule colonoscopy for rectal bleeding and chronic diarrhea in next 2 to 3 weeks  Thanks RV

## 2020-03-01 NOTE — Telephone Encounter (Signed)
PT LEFT VM RETURNING YOUR CALL

## 2020-03-01 NOTE — Telephone Encounter (Signed)
Patient is calling because she states the nitro paste helped at first but now it is not helping. Patient states it feels like there is a knife in her rectal area. Patient states it is keeping her up at night. Patient wants to know what else she can do

## 2020-03-01 NOTE — Telephone Encounter (Signed)
Called and  Left a message for call back

## 2020-03-01 NOTE — Addendum Note (Signed)
Addended by: Ulyess Blossom L on: 03/01/2020 04:13 PM   Modules accepted: Orders

## 2020-03-01 NOTE — Telephone Encounter (Signed)
Called and left a message for call back  

## 2020-03-14 ENCOUNTER — Encounter: Payer: Self-pay | Admitting: Gastroenterology

## 2020-03-14 ENCOUNTER — Ambulatory Visit (INDEPENDENT_AMBULATORY_CARE_PROVIDER_SITE_OTHER): Payer: BC Managed Care – PPO | Admitting: Gastroenterology

## 2020-03-14 DIAGNOSIS — K529 Noninfective gastroenteritis and colitis, unspecified: Secondary | ICD-10-CM | POA: Diagnosis not present

## 2020-03-14 MED ORDER — DICYCLOMINE HCL 10 MG PO CAPS: 10.0000 mg | ORAL_CAPSULE | Freq: Three times a day (TID) | ORAL | 1 refills | Status: DC

## 2020-03-14 NOTE — Progress Notes (Signed)
Sherri Sear, MD [address]  [place]  [place], [place] [address]  Main: [phone]  Fax: [phone]    Gastroenterology Consultation Video Visit  Referring Provider:     [facility] Primary Care Physician:  [facility] [place] Primary Gastroenterologist:  Dr. Cephas Darby Reason for Consultation:   Anal fissure, rectal bleeding, chronic diarrhea        HPI:   Evelyn Estrada is a 39 y.o. female referred by Dr. Thera Flake [facility]  for consultation & management of anal fissure, rectal bleeding, chronic diarrhea  Virtual Visit Video Note  I connected with Evelyn Estrada on 03/14/20 at  3:30 PM EDT by video and verified that I am speaking with the correct person using two identifiers.   I discussed the limitations, risks, security and privacy concerns of performing an evaluation and management service by video and the availability of in person appointments. I also discussed with the patient that there may be a patient responsible charge related to this service. The patient expressed understanding and agreed to proceed.  Location of the Patient: Home  Location of the provider: Office  Persons participating in the visit: Patient and provider only   History of Present Illness: Evelyn Estrada was initially seen by me end of January 2021 secondary to severe rectal pain which is secondary to anal fissure.  Her pain did not relieve with 0.125% nitroglycerin, therefore switched to 0.3% nifedipine with 5% lidocaine.  She reports that she has noticed 50% improvement in her rectal pain and discomfort.  She does have good days and bad days.  She does report loose stools and work-up including stool studies, celiac panel and H. pylori breath test, H. pylori IgG came back unremarkable.  She is scheduled for colonoscopy on 4/1. Patient reports using donut cushion when sitting  NSAIDs: None  Antiplts/Anticoagulants/Anti  thrombotics: None  GI Procedures: None She denies family history of GI malignancy  Past Medical History:  Diagnosis Date  . Chronic joint pain   . Depression   . History of kidney stones    several times  . Hypertension     Past Surgical History:  Procedure Laterality Date  . EXTRACORPOREAL SHOCK WAVE LITHOTRIPSY Right 11/29/2016   Procedure: EXTRACORPOREAL SHOCK WAVE LITHOTRIPSY (ESWL);  Surgeon: Royston Cowper, MD;  Location: [facility] ORS;  Service: Urology;  Laterality: Right;  . TUBAL LIGATION  2002    Current Outpatient Medications:  .  escitalopram (LEXAPRO) 10 MG tablet, Take 10 mg by mouth daily., Disp: , Rfl:  .  hydrochlorothiazide (HYDRODIURIL) 25 MG tablet, Take 25 mg by mouth daily., Disp: , Rfl:  .  hydrOXYzine (ATARAX/VISTARIL) 10 MG tablet, Take 10 mg by mouth 3 (three) times daily., Disp: , Rfl:  .  losartan (COZAAR) 50 MG tablet, Take 50 mg by mouth daily., Disp: , Rfl:  .  Meth-Hyo-M Bl-Na Phos-Ph Sal (URIBEL) 118 MG CAPS, , Disp: , Rfl:  .  norgestimate-ethinyl estradiol (ORTHO-CYCLEN,SPRINTEC,PREVIFEM) 0.25-35 MG-MCG tablet, Take 1 tablet by mouth daily., Disp: 1 Package, Rfl: 1 .  dicyclomine (BENTYL) 10 MG capsule, Take 1 capsule (10 mg total) by mouth 4 (four) times daily -  before meals and at bedtime for 30 doses., Disp: 30 capsule, Rfl: 1   History reviewed. No pertinent family history.   Social History   Tobacco Use  . Smoking status: Former Smoker    Types: Cigarettes    Quit date: 12/24/2000  Years since quitting: 19.2  . Smokeless tobacco: Never Used  Substance Use Topics  . Alcohol use: No  . Drug use: No    Allergies as of 03/14/2020 - Review Complete 03/14/2020  Allergen Reaction Noted  . Phenergan [promethazine hcl] Rash 10/31/2016    Imaging Studies: Reviewed  Assessment and Plan:   Evelyn Estrada is a 39 y.o. 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 pylori breath test, H. pylori IgG unremarkable Trial of Bentyl 10 mg 3-4 times a day as needed    Follow Up Instructions:   I discussed the assessment and treatment plan with the patient. The patient was provided an opportunity to ask questions and all were answered. The patient agreed with the plan and demonstrated an understanding of the instructions.   The patient was advised to call back or seek an in-person evaluation if the symptoms worsen or if the condition fails to improve as anticipated.  I provided 12 minutes of face-to-face time during this encounter.   Follow up in 1 to 2 months   Cephas Darby, MD

## 2020-03-21 ENCOUNTER — Telehealth: Payer: Self-pay

## 2020-03-21 NOTE — Telephone Encounter (Signed)
Patient is experiencing symptoms due to Covid vaccination on Friday. Has fever and cold symptoms. Due to have coloscopy on Thurs & Covid testing on Tues. Will call tomo to let us know how she's feeling. Patient may cancel

## 2020-03-21 NOTE — Telephone Encounter (Signed)
Dr. Marius Ditch, see message below. FYI.

## 2020-03-22 ENCOUNTER — Other Ambulatory Visit: Payer: BC Managed Care – PPO | Attending: Gastroenterology

## 2020-03-22 NOTE — Telephone Encounter (Signed)
Patient is still not feeling well. Would like to cancel Thurs procedure.

## 2020-03-24 ENCOUNTER — Ambulatory Visit
Admission: RE | Admit: 2020-03-24 | Payer: BC Managed Care – PPO | Source: Home / Self Care | Admitting: Gastroenterology

## 2020-03-24 ENCOUNTER — Encounter: Admission: RE | Payer: Self-pay | Source: Home / Self Care

## 2020-03-24 SURGERY — COLONOSCOPY WITH PROPOFOL
Anesthesia: General

## 2020-04-25 ENCOUNTER — Telehealth: Payer: Self-pay | Admitting: Gastroenterology

## 2020-04-25 NOTE — Telephone Encounter (Signed)
yes, goahead  RV

## 2020-04-25 NOTE — Telephone Encounter (Signed)
Pt request refill on cream 0.3% nifedipine with 5% lidocaine.

## 2020-04-25 NOTE — Telephone Encounter (Signed)
Can I refill this for patient.  Last office visit 03/14/2020 chronic diarrhea

## 2020-04-25 NOTE — Telephone Encounter (Signed)
Informed patient that this was called in to [facility] by a detail message

## 2020-04-26 ENCOUNTER — Other Ambulatory Visit: Payer: Self-pay | Admitting: Gastroenterology

## 2020-04-26 DIAGNOSIS — K529 Noninfective gastroenteritis and colitis, unspecified: Secondary | ICD-10-CM

## 2020-04-27 ENCOUNTER — Other Ambulatory Visit: Payer: Self-pay

## 2020-04-27 ENCOUNTER — Telehealth: Payer: BC Managed Care – PPO

## 2020-04-27 DIAGNOSIS — K529 Noninfective gastroenteritis and colitis, unspecified: Secondary | ICD-10-CM

## 2020-04-27 DIAGNOSIS — K625 Hemorrhage of anus and rectum: Secondary | ICD-10-CM

## 2020-05-26 ENCOUNTER — Other Ambulatory Visit: Payer: Self-pay

## 2020-05-26 ENCOUNTER — Other Ambulatory Visit
Admission: RE | Admit: 2020-05-26 | Discharge: 2020-05-26 | Disposition: A | Discharge status: Home / Self Care | Payer: BC Managed Care – PPO | Source: Ambulatory Visit | Attending: Gastroenterology | Admitting: Gastroenterology

## 2020-05-26 DIAGNOSIS — Z01812 Encounter for preprocedural laboratory examination: Secondary | ICD-10-CM | POA: Insufficient documentation

## 2020-05-26 DIAGNOSIS — Z20822 Contact with and (suspected) exposure to covid-19: Secondary | ICD-10-CM | POA: Insufficient documentation

## 2020-05-27 ENCOUNTER — Other Ambulatory Visit: Payer: BC Managed Care – PPO

## 2020-05-27 LAB — SARS CORONAVIRUS 2 (TAT 6-24 HRS): SARS Coronavirus 2: NEGATIVE

## 2020-05-30 ENCOUNTER — Ambulatory Visit: Payer: BC Managed Care – PPO | Admitting: Registered Nurse

## 2020-05-30 ENCOUNTER — Other Ambulatory Visit: Payer: Self-pay

## 2020-05-30 ENCOUNTER — Encounter: Payer: Self-pay | Admitting: Gastroenterology

## 2020-05-30 ENCOUNTER — Encounter
Admission: RE | Disposition: A | Discharge status: Home / Self Care | Payer: Self-pay | Source: Home / Self Care | Attending: Gastroenterology

## 2020-05-30 ENCOUNTER — Ambulatory Visit
Admission: RE | Admit: 2020-05-30 | Discharge: 2020-05-30 | Disposition: A | Discharge status: Home / Self Care | Payer: BC Managed Care – PPO | Attending: Gastroenterology | Admitting: Gastroenterology

## 2020-05-30 DIAGNOSIS — F329 Major depressive disorder, single episode, unspecified: Secondary | ICD-10-CM | POA: Diagnosis not present

## 2020-05-30 DIAGNOSIS — Z87891 Personal history of nicotine dependence: Secondary | ICD-10-CM | POA: Diagnosis not present

## 2020-05-30 DIAGNOSIS — K644 Residual hemorrhoidal skin tags: Secondary | ICD-10-CM | POA: Diagnosis not present

## 2020-05-30 DIAGNOSIS — D123 Benign neoplasm of transverse colon: Secondary | ICD-10-CM | POA: Insufficient documentation

## 2020-05-30 DIAGNOSIS — Z793 Long term (current) use of hormonal contraceptives: Secondary | ICD-10-CM | POA: Diagnosis not present

## 2020-05-30 DIAGNOSIS — Z6841 Body Mass Index (BMI) 40.0 and over, adult: Secondary | ICD-10-CM | POA: Diagnosis not present

## 2020-05-30 DIAGNOSIS — D124 Benign neoplasm of descending colon: Secondary | ICD-10-CM | POA: Insufficient documentation

## 2020-05-30 DIAGNOSIS — D120 Benign neoplasm of cecum: Secondary | ICD-10-CM | POA: Insufficient documentation

## 2020-05-30 DIAGNOSIS — Z79899 Other long term (current) drug therapy: Secondary | ICD-10-CM | POA: Diagnosis not present

## 2020-05-30 DIAGNOSIS — Z87442 Personal history of urinary calculi: Secondary | ICD-10-CM | POA: Diagnosis not present

## 2020-05-30 DIAGNOSIS — I10 Essential (primary) hypertension: Secondary | ICD-10-CM | POA: Diagnosis not present

## 2020-05-30 DIAGNOSIS — K635 Polyp of colon: Secondary | ICD-10-CM

## 2020-05-30 DIAGNOSIS — K625 Hemorrhage of anus and rectum: Secondary | ICD-10-CM | POA: Insufficient documentation

## 2020-05-30 DIAGNOSIS — K529 Noninfective gastroenteritis and colitis, unspecified: Secondary | ICD-10-CM | POA: Insufficient documentation

## 2020-05-30 HISTORY — PX: COLONOSCOPY WITH PROPOFOL: SHX5780

## 2020-05-30 LAB — POCT PREGNANCY, URINE: Preg Test, Ur: NEGATIVE

## 2020-05-30 SURGERY — COLONOSCOPY WITH PROPOFOL
Anesthesia: General

## 2020-05-30 MED ORDER — PROPOFOL 500 MG/50ML IV EMUL
INTRAVENOUS | Status: AC
  Filled 2020-05-30: qty 50

## 2020-05-30 MED ORDER — PROPOFOL 10 MG/ML IV BOLUS
INTRAVENOUS | Status: DC | PRN
  Administered 2020-05-30: 100 mg via INTRAVENOUS

## 2020-05-30 MED ORDER — SODIUM CHLORIDE 0.9 % IV SOLN: INTRAVENOUS | Status: DC

## 2020-05-30 MED ORDER — PROPOFOL 500 MG/50ML IV EMUL
INTRAVENOUS | Status: DC | PRN
  Administered 2020-05-30: 150 ug/kg/min via INTRAVENOUS

## 2020-05-30 MED ORDER — MIDAZOLAM HCL 2 MG/2ML IJ SOLN
INTRAMUSCULAR | Status: DC | PRN
  Administered 2020-05-30: 2 mg via INTRAVENOUS

## 2020-05-30 MED ORDER — MIDAZOLAM HCL 2 MG/2ML IJ SOLN
INTRAMUSCULAR | Status: AC
  Filled 2020-05-30: qty 2

## 2020-05-30 MED ORDER — LIDOCAINE HCL (CARDIAC) PF 100 MG/5ML IV SOSY
PREFILLED_SYRINGE | INTRAVENOUS | Status: DC | PRN
  Administered 2020-05-30: 40 mg via INTRAVENOUS

## 2020-05-30 NOTE — Anesthesia Procedure Notes (Signed)
Performed by: Samah Lapiana, CRNA Pre-anesthesia Checklist: Patient identified, Emergency Drugs available, Suction available and Patient being monitored Patient Re-evaluated:Patient Re-evaluated prior to induction Oxygen Delivery Method: Supernova nasal CPAP Induction Type: IV induction Dental Injury: Teeth and Oropharynx as per pre-operative assessment  Comments: Nasal cannula with etCO2 monitoring       

## 2020-05-30 NOTE — Anesthesia Postprocedure Evaluation (Signed)
Anesthesia Post Note  Patient: Evelyn Estrada  Procedure(s) Performed: COLONOSCOPY WITH PROPOFOL (N/A )  Patient location during evaluation: PACU Anesthesia Type: General Level of consciousness: awake and alert Pain management: pain level controlled Vital Signs Assessment: post-procedure vital signs reviewed and stable Respiratory status: spontaneous breathing, nonlabored ventilation, respiratory function stable and patient connected to nasal cannula oxygen Cardiovascular status: blood pressure returned to baseline and stable Postop Assessment: no apparent nausea or vomiting Anesthetic complications: no     Last Vitals:  Vitals:   05/30/20 1039 05/30/20 1049  BP: 128/87 134/65  Pulse: 85 77  Resp: 17 15  Temp: 36.6 C   SpO2: 100% 98%    Last Pain:  Vitals:   05/30/20 1049  TempSrc:   PainSc: 0-No pain                 Molli Barrows

## 2020-05-30 NOTE — Transfer of Care (Signed)
Immediate Anesthesia Transfer of Care Note  Patient: Evelyn Estrada  Procedure(s) Performed: Procedure(s): COLONOSCOPY WITH PROPOFOL (N/A)  Patient Location: PACU and Endoscopy Unit  Anesthesia Type:General  Level of Consciousness: sedated  Airway & Oxygen Therapy: Patient Spontanous Breathing and Patient connected to nasal cannula oxygen  Post-op Assessment: Report given to RN and Post -op Vital signs reviewed and stable  Post vital signs: Reviewed and stable  Last Vitals:  Vitals:   05/30/20 0907 05/30/20 1039  BP: (!) 144/99 128/87  Pulse: 89 85  Resp: 16 17  Temp: (!) 36.2 C 36.6 C  SpO2: 41% 991%    Complications: No apparent anesthesia complications

## 2020-05-30 NOTE — H&P (Signed)
Cephas Darby, MD [address]  [place]  [place], [place] [address]  Main: [phone]  Fax: [phone] Pager: [phone]  Primary Care Physician:  [facility] [place] Primary Gastroenterologist:  Dr. Cephas Darby  Pre-Procedure History & Physical: HPI:  Evelyn Estrada is a 39 y.o. female is here for an colonoscopy.   Past Medical History:  Diagnosis Date  . Chronic joint pain   . Depression   . History of kidney stones    several times  . Hypertension     Past Surgical History:  Procedure Laterality Date  . EXTRACORPOREAL SHOCK WAVE LITHOTRIPSY Right 11/29/2016   Procedure: EXTRACORPOREAL SHOCK WAVE LITHOTRIPSY (ESWL);  Surgeon: Royston Cowper, MD;  Location: [facility] ORS;  Service: Urology;  Laterality: Right;  . TUBAL LIGATION  2002    Prior to Admission medications   Medication Sig Start Date End Date Taking? Authorizing Provider  escitalopram (LEXAPRO) 10 MG tablet Take 10 mg by mouth daily.   Yes [provider]  hydrochlorothiazide (HYDRODIURIL) 25 MG tablet Take 25 mg by mouth daily. 12/21/19  Yes [provider]  hydrOXYzine (ATARAX/VISTARIL) 10 MG tablet Take 10 mg by mouth 3 (three) times daily. 11/17/19  Yes [provider]  losartan (COZAAR) 50 MG tablet Take 50 mg by mouth daily. 12/21/19  Yes [provider]  norgestimate-ethinyl estradiol (ORTHO-CYCLEN,SPRINTEC,PREVIFEM) 0.25-35 MG-MCG tablet Take 1 tablet by mouth daily. 08/04/18  Yes Rod Can, CNM  dicyclomine (BENTYL) 10 MG capsule TAKE 1 CAPSULE BY MOUTH THREE TIMES DAILY BEFORE MEALS AND 1 CAPSULE AT BEDTIME. Patient not taking: Reported on 05/30/2020 04/26/20   Lin Landsman, MD  Meth-Hyo-M Bl-Na Phos-Ph Sal (URIBEL) 118 MG CAPS  12/30/19   [provider]    Allergies as of 04/27/2020 - Review Complete 03/14/2020  Allergen Reaction Noted  . Phenergan [promethazine hcl] Rash 10/31/2016    History reviewed. No  pertinent family history.  Social History   Socioeconomic History  . Marital status: Married    Spouse name: Not on file  . Number of children: Not on file  . Years of education: Not on file  . Highest education level: Not on file  Occupational History  . Not on file  Tobacco Use  . Smoking status: Former Smoker    Types: Cigarettes    Quit date: 12/24/2000    Years since quitting: 19.4  . Smokeless tobacco: Never Used  Substance and Sexual Activity  . Alcohol use: No  . Drug use: No  . Sexual activity: Yes    Birth control/protection: None  Other Topics Concern  . Not on file  Social History Narrative  . Not on file   Social Determinants of Health   Financial Resource Strain:   . Difficulty of Paying Living Expenses:   Food Insecurity:   . Worried About Charity fundraiser in the Last Year:   . Arboriculturist in the Last Year:   Transportation Needs:   . Film/video editor (Medical):   Marland Kitchen Lack of Transportation (Non-Medical):   Physical Activity:   . Days of Exercise per Week:   . Minutes of Exercise per Session:   Stress:   . Feeling of Stress :   Social Connections:   . Frequency of Communication with Friends and Family:   . Frequency of Social Gatherings with Friends and Family:   . Attends Religious Services:   . Active Member of Clubs or Organizations:   .  Attends Archivist Meetings:   Marland Kitchen Marital Status:   Intimate Partner Violence:   . Fear of Current or Ex-Partner:   . Emotionally Abused:   Marland Kitchen Physically Abused:   . Sexually Abused:     Review of Systems: See HPI, otherwise negative ROS  Physical Exam: BP (!) 144/99   Pulse 89   Temp (!) 97.1 F (36.2 C) (Temporal)   Resp 16   Ht 6' (1.829 m)   Wt (!) 145.2 kg   SpO2 99%   BMI 43.40 kg/m  General:   Alert,  pleasant and cooperative in NAD Head:  Normocephalic and atraumatic. Neck:  Supple; no masses or thyromegaly. Lungs:  Clear throughout to auscultation.    Heart:   Regular rate and rhythm. Abdomen:  Soft, nontender and nondistended. Normal bowel sounds, without guarding, and without rebound.   Neurologic:  Alert and  oriented x4;  grossly normal neurologically.  Impression/Plan: Evelyn Estrada is here for an colonoscopy to be performed for chronic diarrhea, rectal bleeding  Risks, benefits, limitations, and alternatives regarding  colonoscopy have been reviewed with the patient.  Questions have been answered.  All parties agreeable.   Sherri Sear, MD  05/30/2020, 9:58 AM

## 2020-05-30 NOTE — Anesthesia Preprocedure Evaluation (Signed)
Anesthesia Evaluation  Patient identified by MRN, date of birth, ID band Patient awake    Reviewed: Allergy & Precautions, H&P , NPO status , Patient's Chart, lab work & pertinent test results, reviewed documented beta blocker date and time   Airway Mallampati: II   Neck ROM: full    Dental  (+) Poor Dentition   Pulmonary neg pulmonary ROS, former smoker,    Pulmonary exam normal        Cardiovascular Exercise Tolerance: Good hypertension, On Medications negative cardio ROS Normal cardiovascular exam Rhythm:regular Rate:Normal     Neuro/Psych PSYCHIATRIC DISORDERS Depression negative neurological ROS     GI/Hepatic negative GI ROS, Neg liver ROS,   Endo/Other  Morbid obesity  Renal/GU negative Renal ROS  negative genitourinary   Musculoskeletal   Abdominal   Peds  Hematology negative hematology ROS (+)   Anesthesia Other Findings Past Medical History: No date: Chronic joint pain No date: Depression No date: History of kidney stones     Comment:  several times No date: Hypertension Past Surgical History: 11/29/2016: EXTRACORPOREAL SHOCK WAVE LITHOTRIPSY; Right     Comment:  Procedure: EXTRACORPOREAL SHOCK WAVE LITHOTRIPSY (ESWL);              Surgeon: Royston Cowper, MD;  Location: [facility] ORS;                Service: Urology;  Laterality: Right; 2002: TUBAL LIGATION BMI    Body Mass Index: 43.40 kg/m     Reproductive/Obstetrics negative OB ROS                             Anesthesia Physical Anesthesia Plan  ASA: III  Anesthesia Plan: General   Post-op Pain Management:    Induction:   PONV Risk Score and Plan:   Airway Management Planned:   Additional Equipment:   Intra-op Plan:   Post-operative Plan:   Informed Consent: I have reviewed the patients History and Physical, chart, labs and discussed the procedure including the risks, benefits and alternatives for the  proposed anesthesia with the patient or authorized representative who has indicated his/her understanding and acceptance.     Dental Advisory Given  Plan Discussed with: CRNA  Anesthesia Plan Comments:         Anesthesia Quick Evaluation

## 2020-05-30 NOTE — Op Note (Signed)
[facility] Gastroenterology Patient Name: Evelyn Estrada Procedure Date: 05/30/2020 9:55 AM MRN: [id number] Account #: [id number] Date of Birth: 08/03/1981 Admit Type: Outpatient Age: 39 Room: [facility] ENDO ROOM 1 Gender: Female Note Status: Finalized Procedure:             Colonoscopy Indications:           Chronic diarrhea, Rectal bleeding Providers:             Lin Landsman MD, MD Referring MD:          Ko Olina:             Monitored Anesthesia Care Complications:         No immediate complications. Estimated blood loss: None. Procedure:             Pre-Anesthesia Assessment:                        - Prior to the procedure, a History and Physical was                         performed, and patient medications and allergies were                         reviewed. The patient is competent. The risks and                         benefits of the procedure and the sedation options and                         risks were discussed with the patient. All questions                         were answered and informed consent was obtained.                         Patient identification and proposed procedure were                         verified by the physician, the nurse, the                         anesthesiologist, the anesthetist and the technician                         in the pre-procedure area in the procedure room in the                         endoscopy suite. Mental Status Examination: alert and                         oriented. Airway Examination: normal oropharyngeal                         airway and neck mobility. Respiratory Examination:                         clear to auscultation. CV Examination: normal.  Prophylactic Antibiotics: The patient does not require                         prophylactic antibiotics. Prior Anticoagulants: The                         patient has taken no previous  anticoagulant or                         antiplatelet agents. ASA Grade Assessment: III - A                         patient with severe systemic disease. After reviewing                         the risks and benefits, the patient was deemed in                         satisfactory condition to undergo the procedure. The                         anesthesia plan was to use monitored anesthesia care                         (MAC). Immediately prior to administration of                         medications, the patient was re-assessed for adequacy                         to receive sedatives. The heart rate, respiratory                         rate, oxygen saturations, blood pressure, adequacy of                         pulmonary ventilation, and response to care were                         monitored throughout the procedure. The physical                         status of the patient was re-assessed after the                         procedure.                        After obtaining informed consent, the colonoscope was                         passed under direct vision. Throughout the procedure,                         the patient's blood pressure, pulse, and oxygen                         saturations were monitored continuously. The  Colonoscope was introduced through the anus and                         advanced to the the cecum, identified by appendiceal                         orifice and ileocecal valve. The colonoscopy was                         performed with moderate difficulty due to poor bowel                         prep, significant looping and the patient's body                         habitus. Successful completion of the procedure was                         aided by changing the patient to a prone position and                         applying abdominal pressure. The patient tolerated the                         procedure well. The quality of the bowel  preparation                         was fair and poor. Findings:      Five sessile polyps were found in the descending colon, transverse colon       and cecum. The polyps were 3 to 5 mm in size. These polyps were removed       with a cold snare. Resection and retrieval were complete.      Normal mucosa was found in the entire colon.      Non-bleeding external hemorrhoids were found during retroflexion. The       hemorrhoids were small. Impression:            - Preparation of the colon was fair.                        - Preparation of the colon was poor.                        - Five 3 to 5 mm polyps in the descending colon, in                         the transverse colon and in the cecum, removed with a                         cold snare. Resected and retrieved.                        - Normal mucosa in the entire examined colon.                        - Non-bleeding external hemorrhoids. Recommendation:        - Discharge patient to home (with escort).                        -  Resume previous diet daily.                        - Continue present medications.                        - Await pathology results.                        - Repeat colonoscopy with 2 day prep in 3 years for                         surveillance of multiple polyps. Procedure Code(s):     --- Professional ---                        [phone], Colonoscopy, flexible; with removal of                         tumor(s), polyp(s), or other lesion(s) by snare                         technique Diagnosis Code(s):     --- Professional ---                        K64.4, Residual hemorrhoidal skin tags                        K63.5, Polyp of colon                        K52.9, Noninfective gastroenteritis and colitis,                         unspecified                        K62.5, Hemorrhage of anus and rectum CPT copyright 2019 American Medical Association. All rights reserved. The codes documented in this report are  preliminary and upon coder review may  be revised to meet current compliance requirements. Dr. Ulyess Mort Lin Landsman MD, MD 05/30/2020 10:37:35 AM This report has been signed electronically. Number of Addenda: 0 Note Initiated On: 05/30/2020 9:55 AM Scope Withdrawal Time: 0 hours 13 minutes 46 seconds  Total Procedure Duration: 0 hours 22 minutes 4 seconds  Estimated Blood Loss:  Estimated blood loss: none.      [facility]

## 2020-05-31 ENCOUNTER — Encounter: Payer: Self-pay | Admitting: *Deleted

## 2020-05-31 LAB — SURGICAL PATHOLOGY

## 2020-06-01 ENCOUNTER — Other Ambulatory Visit: Payer: Self-pay

## 2020-06-01 DIAGNOSIS — K529 Noninfective gastroenteritis and colitis, unspecified: Secondary | ICD-10-CM

## 2020-06-01 MED ORDER — DICYCLOMINE HCL 10 MG PO CAPS: ORAL_CAPSULE | ORAL | 2 refills | Status: DC

## 2020-07-08 ENCOUNTER — Other Ambulatory Visit: Payer: Self-pay

## 2020-07-08 ENCOUNTER — Ambulatory Visit (INDEPENDENT_AMBULATORY_CARE_PROVIDER_SITE_OTHER): Payer: BC Managed Care – PPO | Admitting: Gastroenterology

## 2020-07-08 ENCOUNTER — Encounter: Payer: Self-pay | Admitting: Gastroenterology

## 2020-07-08 VITALS — BP 130/87 | HR 84 | Temp 97.4°F | Ht 73.0 in | Wt 312.4 lb

## 2020-07-08 DIAGNOSIS — K644 Residual hemorrhoidal skin tags: Secondary | ICD-10-CM | POA: Diagnosis not present

## 2020-07-08 DIAGNOSIS — K580 Irritable bowel syndrome with diarrhea: Secondary | ICD-10-CM | POA: Diagnosis not present

## 2020-07-08 DIAGNOSIS — K589 Irritable bowel syndrome without diarrhea: Secondary | ICD-10-CM | POA: Insufficient documentation

## 2020-07-08 NOTE — Progress Notes (Signed)
Cephas Darby, MD [address]  [place]  [place], [place] [address]  Main: [phone]  Fax: [phone]    Gastroenterology Consultation  Referring Provider:     [facility] Primary Care Physician:  [facility] Primary Gastroenterologist:  Dr. Cephas Darby Reason for Consultation: Chronic diarrhea, rectal pain        HPI:   Jayde Cooner is a 39 y.o. female referred by Dr. Thera Flake [facility]  for consultation & management of chronic diarrhea and rectal pain Rectal pain: Patient reports that for the last few months, she has been having severe rectal discomfort, burning as well as sharp/stabbing pain every time she has a bowel movement.  She is trying Anusol suppository which provides minimal relief only.  She does notice perianal itching, irritation, bright red blood per rectum upon wiping and in the toilet bowl, severe burning and sometimes prolapse of the tissue during a BM.  She has been experiencing the symptoms for a few years, have worsened in the last few months She has been taking ibuprofen and Tylenol 2 times daily rectal pain  Chronic diarrhea: Ongoing for last 2 years, postprandial urgency, watery bowel movements sometimes mixed with blood.  She denies drinking carbonated beverages.  However, does eat high-calorie foods, snacks regularly as well as red meat regularly  Follow-up video visit 03/14/2020 Ms. Doby was initially seen by me end of January 2021 secondary to severe rectal pain which is secondary to anal fissure.  Her pain did not relieve with 0.125% nitroglycerin, therefore switched to 0.3% nifedipine with 5% lidocaine.  She reports that she has noticed 50% improvement in her rectal pain and discomfort.  She does have good days and bad days.  She does report loose stools and work-up including stool studies, celiac panel and H. pylori breath test, H. pylori IgG came back unremarkable.  She  is scheduled for colonoscopy on 4/1. Patient reports using donut cushion when sitting  Follow-up visit 07/08/2020 Patient has ongoing symptoms intermittent rectal bleeding, perianal itching, rectal pressure, rectal pain and sometimes burning.  She has been applying topical nitroglycerin for treatment of anal fissure which provided modest relief.  She has not used this medication for last 2 weeks.  She is interested in hemorrhoid ligation. Patient is accompanied by her mom today  NSAIDs: None  Antiplts/Anticoagulants/Anti thrombotics: None  GI Procedures:  05/30/2020 - Preparation of the colon was fair. - Preparation of the colon was poor. - Five 3 to 5 mm polyps in the descending colon, in the transverse colon and in the cecum, removed with a cold snare. Resected and retrieved. - Normal mucosa in the entire examined colon. - Non-bleeding external hemorrhoids.  DIAGNOSIS:  A. COLON POLYP, CECUM; COLD SNARE:  - TUBULAR ADENOMA.  - NEGATIVE FOR HIGH-GRADE DYSPLASIA AND MALIGNANCY.   B. COLON POLYPS X2, TRANSVERSE; COLD SNARE:  - FRAGMENTS (X3) OF TUBULAR ADENOMAS.  - MULTIPLE FRAGMENTS OF BENIGN COLONIC MUCOSA WITH SUPERFICIAL REACTIVE  CHANGES AND PROMINENT LYMPHOID AGGREGATES.  - NEGATIVE FOR HIGH-GRADE DYSPLASIA AND MALIGNANCY.   She denies family history of GI malignancy  Past Medical History:  Diagnosis Date  . Chronic joint pain   . Depression   . History of kidney stones    several times  . Hypertension     Past Surgical History:  Procedure Laterality Date  . COLONOSCOPY WITH PROPOFOL N/A 05/30/2020   Procedure: COLONOSCOPY WITH PROPOFOL;  Surgeon: Marius Ditch,  Tally Due, MD;  Location: [facility];  Service: Gastroenterology;  Laterality: N/A;  . EXTRACORPOREAL SHOCK WAVE LITHOTRIPSY Right 11/29/2016   Procedure: EXTRACORPOREAL SHOCK WAVE LITHOTRIPSY (ESWL);  Surgeon: Royston Cowper, MD;  Location: [facility];  Service: Urology;  Laterality: Right;  . TUBAL LIGATION   2002    Current Outpatient Medications:  .  dicyclomine (BENTYL) 10 MG capsule, TAKE 1 CAPSULE BY MOUTH THREE TIMES DAILY BEFORE MEALS AND 1 CAPSULE AT BEDTIME., Disp: 30 capsule, Rfl: 2 .  escitalopram (LEXAPRO) 10 MG tablet, Take 10 mg by mouth daily., Disp: , Rfl:  .  hydrochlorothiazide (HYDRODIURIL) 25 MG tablet, Take 25 mg by mouth daily., Disp: , Rfl:  .  hydrOXYzine (ATARAX/VISTARIL) 10 MG tablet, Take 10 mg by mouth 3 (three) times daily., Disp: , Rfl:  .  losartan (COZAAR) 50 MG tablet, Take 50 mg by mouth daily., Disp: , Rfl:  .  Meth-Hyo-M Bl-Na Phos-Ph Sal (URIBEL) 118 MG CAPS, , Disp: , Rfl:  .  norgestimate-ethinyl estradiol (ORTHO-CYCLEN,SPRINTEC,PREVIFEM) 0.25-35 MG-MCG tablet, Take 1 tablet by mouth daily., Disp: 1 Package, Rfl: 1   History reviewed. No pertinent family history.   Social History   Tobacco Use  . Smoking status: Former Smoker    Types: Cigarettes    Quit date: 12/24/2000    Years since quitting: 19.5  . Smokeless tobacco: Never Used  Vaping Use  . Vaping Use: Never used  Substance Use Topics  . Alcohol use: No  . Drug use: No    Allergies as of 07/08/2020 - Review Complete 07/08/2020  Allergen Reaction Noted  . Phenergan [promethazine hcl] Rash 10/31/2016    Review of Systems:    All systems reviewed and negative except where noted in HPI.   Physical Exam:  BP 130/87 (BP Location: Left Arm, Patient Position: Sitting, Cuff Size: Normal)   Pulse 84   Temp (!) 97.4 F (36.3 C) (Oral)   Ht 6\' 1"  (1.854 m)   Wt (!) 312 lb 6 oz (141.7 kg)   BMI 41.21 kg/m  No LMP recorded.  General:   Alert,  Well-developed, well-nourished, pleasant and cooperative in NAD Head:  Normocephalic and atraumatic. Eyes:  Sclera clear, no icterus.   Conjunctiva pink. Ears:  Normal auditory acuity. Nose:  No deformity, discharge, or lesions. Mouth:  No deformity or lesions,oropharynx pink & moist. Neck:  Supple; no masses or thyromegaly. Lungs:  Respirations  even and unlabored.  Clear throughout to auscultation.   No wheezes, crackles, or rhonchi. No acute distress. Heart:  Regular rate and rhythm; no murmurs, clicks, rubs, or gallops. Abdomen:  Normal bowel sounds. Soft, morbidly obese, non-tender and non-distended without masses, No guarding or rebound tenderness.   Rectal: Perianal skin tags, moderate tenderness in the anterior and posterior wall of the anal canal associated with anal spasm Msk:  Symmetrical without gross deformities. Good, equal movement & strength bilaterally. Pulses:  Normal pulses noted. Extremities:  No clubbing or edema.  No cyanosis. Neurologic:  Alert and oriented x3;  grossly normal neurologically. Skin:  Intact without significant lesions or rashes. No jaundice. Psych:  Alert and cooperative. Normal mood and affect.  Imaging Studies: Reviewed  Assessment and Plan:   Rayelynn Wilcher is a 39 y.o. female with morbid obesity, seen in consultation for chronic diarrhea and symptomatic external hemorrhoids, s/p treatment for anal fissure  Symptomatic external hemorrhoids, grade 1 Discussed about risks and benefits of hemorrhoid ligation, consent obtained Perform hemorrhoid ligation today, patient is agreeable  Chronic diarrhea: celiac serologies, H. pylori IgG, H. pylori breath test unremarkable, stool studies negative for infectious etiology Colonoscopy is unremarkable Likely, secondary to IBS.  Will try Bentyl as needed  History of tubular adenomas of colon Recommend surveillance colonoscopy in 05/2023   Follow up in 1 month   Cephas Darby, MD

## 2020-07-08 NOTE — Progress Notes (Signed)
PROCEDURE NOTE: The patient presents with symptomatic grade 1 hemorrhoids, unresponsive to maximal medical therapy, requesting rubber band ligation of his/her hemorrhoidal disease.  All risks, benefits and alternative forms of therapy were described and informed consent was obtained.  The decision was made to band the RA internal hemorrhoid, and the [place] was used to perform band ligation without complication.  Digital anorectal examination was then performed to assure proper positioning of the band, and to adjust the banded tissue as required.  The patient was discharged home without pain or other issues.  Dietary and behavioral recommendations were given and (if necessary - prescriptions were given), along with follow-up instructions.  The patient will return 4 weeks for follow-up and possible additional banding as required.  No complications were encountered and the patient tolerated the procedure well.

## 2020-08-15 ENCOUNTER — Other Ambulatory Visit: Payer: Self-pay | Admitting: Gastroenterology

## 2020-08-15 DIAGNOSIS — K529 Noninfective gastroenteritis and colitis, unspecified: Secondary | ICD-10-CM

## 2020-08-18 ENCOUNTER — Ambulatory Visit: Payer: BC Managed Care – PPO | Admitting: Gastroenterology

## 2020-11-09 ENCOUNTER — Other Ambulatory Visit: Payer: Self-pay | Admitting: Gastroenterology

## 2020-11-09 DIAGNOSIS — K529 Noninfective gastroenteritis and colitis, unspecified: Secondary | ICD-10-CM

## 2020-11-09 NOTE — Telephone Encounter (Signed)
Last office visit 07/08/2020 Hemorrhoids   Last refill 08/16/2020 30 1 refill

## 2021-11-18 ENCOUNTER — Encounter: Payer: Self-pay | Admitting: Internal Medicine

## 2021-11-18 ENCOUNTER — Emergency Department: Payer: BC Managed Care – PPO

## 2021-11-18 ENCOUNTER — Inpatient Hospital Stay: Payer: BC Managed Care – PPO

## 2021-11-18 ENCOUNTER — Inpatient Hospital Stay
Admission: EM | Admit: 2021-11-18 | Discharge: 2021-11-19 | DRG: 193 | Disposition: A | Discharge status: Home / Self Care | Payer: BC Managed Care – PPO | Attending: Family Medicine | Admitting: Family Medicine

## 2021-11-18 ENCOUNTER — Other Ambulatory Visit: Payer: Self-pay

## 2021-11-18 DIAGNOSIS — H9202 Otalgia, left ear: Secondary | ICD-10-CM | POA: Diagnosis present

## 2021-11-18 DIAGNOSIS — Z20822 Contact with and (suspected) exposure to covid-19: Secondary | ICD-10-CM | POA: Diagnosis present

## 2021-11-18 DIAGNOSIS — Z888 Allergy status to other drugs, medicaments and biological substances status: Secondary | ICD-10-CM | POA: Diagnosis not present

## 2021-11-18 DIAGNOSIS — J101 Influenza due to other identified influenza virus with other respiratory manifestations: Principal | ICD-10-CM | POA: Diagnosis present

## 2021-11-18 DIAGNOSIS — Z87440 Personal history of urinary (tract) infections: Secondary | ICD-10-CM | POA: Diagnosis not present

## 2021-11-18 DIAGNOSIS — R739 Hyperglycemia, unspecified: Secondary | ICD-10-CM | POA: Diagnosis present

## 2021-11-18 DIAGNOSIS — F32A Depression, unspecified: Secondary | ICD-10-CM | POA: Diagnosis present

## 2021-11-18 DIAGNOSIS — F411 Generalized anxiety disorder: Secondary | ICD-10-CM | POA: Diagnosis present

## 2021-11-18 DIAGNOSIS — Z9851 Tubal ligation status: Secondary | ICD-10-CM

## 2021-11-18 DIAGNOSIS — G8929 Other chronic pain: Secondary | ICD-10-CM | POA: Diagnosis present

## 2021-11-18 DIAGNOSIS — Z79899 Other long term (current) drug therapy: Secondary | ICD-10-CM | POA: Diagnosis not present

## 2021-11-18 DIAGNOSIS — E669 Obesity, unspecified: Secondary | ICD-10-CM | POA: Diagnosis present

## 2021-11-18 DIAGNOSIS — I10 Essential (primary) hypertension: Secondary | ICD-10-CM | POA: Diagnosis present

## 2021-11-18 DIAGNOSIS — Z87891 Personal history of nicotine dependence: Secondary | ICD-10-CM

## 2021-11-18 DIAGNOSIS — R0989 Other specified symptoms and signs involving the circulatory and respiratory systems: Secondary | ICD-10-CM | POA: Diagnosis not present

## 2021-11-18 DIAGNOSIS — J111 Influenza due to unidentified influenza virus with other respiratory manifestations: Secondary | ICD-10-CM

## 2021-11-18 DIAGNOSIS — J9691 Respiratory failure, unspecified with hypoxia: Secondary | ICD-10-CM | POA: Diagnosis present

## 2021-11-18 DIAGNOSIS — Z6841 Body Mass Index (BMI) 40.0 and over, adult: Secondary | ICD-10-CM | POA: Diagnosis not present

## 2021-11-18 DIAGNOSIS — Z87442 Personal history of urinary calculi: Secondary | ICD-10-CM | POA: Diagnosis not present

## 2021-11-18 DIAGNOSIS — J9601 Acute respiratory failure with hypoxia: Secondary | ICD-10-CM | POA: Diagnosis present

## 2021-11-18 DIAGNOSIS — K589 Irritable bowel syndrome without diarrhea: Secondary | ICD-10-CM | POA: Diagnosis present

## 2021-11-18 DIAGNOSIS — R0902 Hypoxemia: Secondary | ICD-10-CM

## 2021-11-18 LAB — BRAIN NATRIURETIC PEPTIDE: B Natriuretic Peptide: 31 pg/mL (ref 0.0–100.0)

## 2021-11-18 LAB — URINALYSIS, COMPLETE (UACMP) WITH MICROSCOPIC
Bilirubin Urine: NEGATIVE
Glucose, UA: NEGATIVE mg/dL
Hgb urine dipstick: NEGATIVE
Ketones, ur: NEGATIVE mg/dL
Leukocytes,Ua: NEGATIVE
Nitrite: NEGATIVE
Specific Gravity, Urine: >1.03 — ABNORMAL HIGH (ref 1.005–1.030)
pH: 5.5 (ref 5.0–8.0)

## 2021-11-18 LAB — CBC
HCT: 40.5 % (ref 36.0–46.0)
Hemoglobin: 13.3 g/dL (ref 12.0–15.0)
MCH: 30.2 pg (ref 26.0–34.0)
MCHC: 32.8 g/dL (ref 30.0–36.0)
MCV: 91.8 fL (ref 80.0–100.0)
Platelets: 318 10*3/uL (ref 150–400)
RBC: 4.41 MIL/uL (ref 3.87–5.11)
RDW: 13.6 % (ref 11.5–15.5)
WBC: 8.6 10*3/uL (ref 4.0–10.5)
nRBC: 0 % (ref 0.0–0.2)

## 2021-11-18 LAB — BASIC METABOLIC PANEL
Anion gap: 8 (ref 5–15)
BUN: 12 mg/dL (ref 6–20)
CO2: 20 mmol/L — ABNORMAL LOW (ref 22–32)
Calcium: 8.3 mg/dL — ABNORMAL LOW (ref 8.9–10.3)
Chloride: 105 mmol/L (ref 98–111)
Creatinine, Ser: 0.54 mg/dL (ref 0.44–1.00)
GFR, Estimated: >60 mL/min (ref >60)
Glucose, Bld: 127 mg/dL — ABNORMAL HIGH (ref 70–99)
Potassium: 3.5 mmol/L (ref 3.5–5.1)
Sodium: 133 mmol/L — ABNORMAL LOW (ref 135–145)

## 2021-11-18 LAB — RESP PANEL BY RT-PCR (FLU A&B, COVID) ARPGX2
Influenza A by PCR: POSITIVE — AB
Influenza B by PCR: NEGATIVE
SARS Coronavirus 2 by RT PCR: NEGATIVE

## 2021-11-18 LAB — TROPONIN I (HIGH SENSITIVITY): Troponin I (High Sensitivity): 2 ng/L (ref <18)

## 2021-11-18 LAB — D-DIMER, QUANTITATIVE: D-Dimer, Quant: 0.56 ug/mL-FEU — ABNORMAL HIGH (ref 0.00–0.50)

## 2021-11-18 LAB — PROCALCITONIN: Procalcitonin: <0.1 ng/mL

## 2021-11-18 LAB — PREGNANCY, URINE: Preg Test, Ur: NEGATIVE

## 2021-11-18 MED ORDER — ONDANSETRON HCL 4 MG PO TABS: 4.0000 mg | ORAL_TABLET | Freq: Four times a day (QID) | ORAL | Status: DC | PRN

## 2021-11-18 MED ORDER — HYDRALAZINE HCL 20 MG/ML IJ SOLN: 5.0000 mg | Freq: Four times a day (QID) | INTRAMUSCULAR | Status: DC | PRN

## 2021-11-18 MED ORDER — IBUPROFEN 800 MG PO TABS
800.0000 mg | ORAL_TABLET | Freq: Once | ORAL | Status: AC
  Administered 2021-11-18: 800 mg via ORAL
  Filled 2021-11-18: qty 1

## 2021-11-18 MED ORDER — ACETAMINOPHEN 650 MG RE SUPP: 650.0000 mg | Freq: Four times a day (QID) | RECTAL | Status: DC | PRN

## 2021-11-18 MED ORDER — ONDANSETRON HCL 4 MG/2ML IJ SOLN
4.0000 mg | Freq: Four times a day (QID) | INTRAMUSCULAR | Status: DC | PRN
  Administered 2021-11-19: 01:00:00 4 mg via INTRAVENOUS
  Filled 2021-11-18: qty 2

## 2021-11-18 MED ORDER — ACETAMINOPHEN 325 MG PO TABS
650.0000 mg | ORAL_TABLET | Freq: Four times a day (QID) | ORAL | Status: DC | PRN
  Administered 2021-11-19 (×2): 650 mg via ORAL
  Filled 2021-11-18 (×2): qty 2

## 2021-11-18 MED ORDER — SODIUM CHLORIDE 0.9 % IV BOLUS
1000.0000 mL | Freq: Once | INTRAVENOUS | Status: AC
  Administered 2021-11-18: 1000 mL via INTRAVENOUS

## 2021-11-18 MED ORDER — SODIUM CHLORIDE 0.9 % IV SOLN: INTRAVENOUS | Status: DC

## 2021-11-18 MED ORDER — OSELTAMIVIR PHOSPHATE 75 MG PO CAPS
75.0000 mg | ORAL_CAPSULE | Freq: Two times a day (BID) | ORAL | Status: DC
  Administered 2021-11-18 – 2021-11-19 (×2): 75 mg via ORAL
  Filled 2021-11-18 (×3): qty 1

## 2021-11-18 MED ORDER — HEPARIN SODIUM (PORCINE) 5000 UNIT/ML IJ SOLN
5000.0000 [IU] | Freq: Three times a day (TID) | INTRAMUSCULAR | Status: DC
  Administered 2021-11-18 – 2021-11-19 (×2): 5000 [IU] via SUBCUTANEOUS
  Filled 2021-11-18 (×2): qty 1

## 2021-11-18 MED ORDER — IPRATROPIUM-ALBUTEROL 0.5-2.5 (3) MG/3ML IN SOLN
3.0000 mL | Freq: Once | RESPIRATORY_TRACT | Status: AC
  Administered 2021-11-18: 3 mL via RESPIRATORY_TRACT
  Filled 2021-11-18: qty 3

## 2021-11-18 MED ORDER — IOHEXOL 350 MG/ML SOLN
100.0000 mL | Freq: Once | INTRAVENOUS | Status: AC | PRN
  Administered 2021-11-18: 100 mL via INTRAVENOUS
  Filled 2021-11-18: qty 100

## 2021-11-18 NOTE — ED Notes (Signed)
Pt assisted ot bR.

## 2021-11-18 NOTE — ED Provider Notes (Addendum)
[facility] Emergency Department Provider Note  ____________________________________________   Event Date/Time   First MD Initiated Contact with Patient 11/18/21 1909     (approximate)  I have reviewed the triage vital signs and the nursing notes.   HISTORY  Chief Complaint Cough, Fever, Generalized Body Aches, and Headache    HPI Maurice Montijo is a 40 y.o. female patient seen in the lobby.  She is complaining of cough fever aching all over and headache.  Cough is nonproductive.  She has had a fever at home.  Past medical history as below.  She felt short of breath and pulse ox reading was 89 on room air.  She is put on oxygen.        Past Medical History:  Diagnosis Date   Chronic joint pain    Depression    History of kidney stones    several times   Hypertension     Patient Active Problem List   Diagnosis Date Noted   IBS (irritable bowel syndrome) 07/08/2020   Rectal bleeding     Past Surgical History:  Procedure Laterality Date   COLONOSCOPY WITH PROPOFOL N/A 05/30/2020   Procedure: COLONOSCOPY WITH PROPOFOL;  Surgeon: Lin Landsman, MD;  Location: [facility] ENDOS[facility];  Service: Gastroenterology;  Laterality: N/A;   EXTRACORPOREAL SHOCK WAVE LITHOTRIPSY Right 11/29/2016   Procedure: EXTRACORPOREAL SHOCK WAVE LITHOTRIPSY (ESWL);  Surgeon: Royston Cowper, MD;  Location: [facility];  Service: Urology;  Laterality: Right;   TUBAL LIGATION  2002    Prior to Admission medications   Medication Sig Start Date End Date Taking? Authorizing Provider  dicyclomine (BENTYL) 10 MG capsule TAKE 1 CAPSULE BY MOUTH THREE TIMES DAILY BEFORE MEALS AND 1 CAPSULE AT BEDTIME. 11/09/20   Vanga, Tally Due, MD  escitalopram (LEXAPRO) 10 MG tablet Take 10 mg by mouth daily.    [provider]  hydrochlorothiazide (HYDRODIURIL) 25 MG tablet Take 25 mg by mouth daily. 12/21/19   [provider]  hydrOXYzine (ATARAX/VISTARIL) 10 MG tablet  Take 10 mg by mouth 3 (three) times daily. 11/17/19   [provider]  losartan (COZAAR) 50 MG tablet Take 50 mg by mouth daily. 12/21/19   [provider]  Meth-Hyo-M Bl-Na Phos-Ph Sal (URIBEL) [place]  12/30/19   [provider]  norgestimate-ethinyl estradiol (ORTHO-CYCLEN,SPRINTEC,PREVIFEM) 0.25-35 MG-MCG tablet Take 1 tablet by mouth daily. 08/04/18   Rod Can, CNM    Allergies Phenergan [promethazine hcl]  No family history on file.  Social History Social History   Tobacco Use   Smoking status: Former    Types: Cigarettes    Quit date: 12/24/2000    Years since quitting: 20.9   Smokeless tobacco: Never  Vaping Use   Vaping Use: Never used  Substance Use Topics   Alcohol use: No   Drug use: No    Review of Systems  Constitutional:  fever/chills Eyes: No visual changes. ENT: No sore throat. Cardiovascular: Musculoskeletal: No chest pain Respiratory: shortness of breath. Gastrointestinal: Skill skeletal kind of abdominal pain abdominal pain.  No nausea, no vomiting.  No diarrhea.  No constipation. Genitourinary: Negative for dysuria.  But urine smells Musculoskeletal: Musculoskeletal back pain. Skin: Negative for rash. Neurological:  headaches, no focal weakness    ____________________________________________   PHYSICAL EXAM:  VITAL SIGNS: ED Triage Vitals  Enc Vitals Group     BP 11/18/21 1528 (!) 157/104     Pulse Rate 11/18/21 1528 (!) 116  Resp 11/18/21 1528 20     Temp 11/18/21 1528 99.6 F (37.6 C)     Temp Source 11/18/21 1528 Oral     SpO2 11/18/21 1528 (!) 89 %     Weight 11/18/21 1529 (!) 315 lb (142.9 kg)     Height 11/18/21 1529 6' (1.829 m)     Head Circumference --      Peak Flow --      Pain Score --      Pain Loc --      Pain Edu? --      Excl. in [place]? --     Constitutional: Alert and oriented.  Looks tired and ill Eyes: Conjunctivae are normal. Head: Atraumatic. Nose:  congestion/rhinnorhea. Mouth/Throat: Mucous membranes are moist.  Neck: No stridor.  Cardiovascular: Normal rate, regular rhythm. Grossly normal heart sounds.  Good peripheral circulation. Respiratory: Normal respiratory effort.  No retractions. Lungs CTAB. Gastrointestinal: Soft and nontender. No distention. No abdominal bruits.  Musculoskeletal: No lower extremity tenderness nor edema.  Neurologic:  Normal speech and language. No gross focal neurologic deficits are appreciated.  Skin:  Skin is warm, dry and intact. No rash noted.   ____________________________________________   LABS (all labs ordered are listed, but only abnormal results are displayed)  Labs Reviewed  RESP PANEL BY RT-PCR (FLU A&B, COVID) ARPGX2 - Abnormal; Notable for the following components:      Result Value   Influenza A by PCR POSITIVE (*)    All other components within normal limits  BASIC METABOLIC PANEL - Abnormal; Notable for the following components:   Sodium 133 (*)    CO2 20 (*)    Glucose, Bld 127 (*)    Calcium 8.3 (*)    All other components within normal limits  URINALYSIS, COMPLETE (UACMP) WITH MICROSCOPIC - Abnormal; Notable for the following components:   Specific Gravity, Urine >1.030 (*)    Protein, ur TRACE (*)    Bacteria, UA RARE (*)    All other components within normal limits  CBC  PREGNANCY, URINE  PROCALCITONIN  BRAIN NATRIURETIC PEPTIDE  POC URINE PREG, ED  TROPONIN I (HIGH SENSITIVITY)   ____________________________________________  EKG   ____________________________________________  RADIOLOGY Gertha Calkin, personally viewed and evaluated these images (plain radiographs) as part of my medical decision making, as well as reviewing the written report by the radiologist.  ED MD interpretation: Chest x-ray read by radiology reviewed by me shows a lot of haziness bilaterally and some in the minor right middle lobe.  This could be pulmonary edema or viral disease  radiologist says.  Patient is somewhat hypoxic.  Official radiology report(s): DG Chest 2 View  Result Date: 11/18/2021 CLINICAL DATA:  Shortness of breath EXAM: CHEST - 2 VIEW COMPARISON:  None. FINDINGS: The heart and mediastinal contours are within normal limits. Streaky airspace opacities at the left base. No focal consolidation. Slightly increased interstitial markings. No pleural effusion. No pneumothorax. No acute osseous abnormality. IMPRESSION: 1. Mild pulmonary edema versus viral disease. 2. Streaky airspace opacities at the left base likely representing atelectasis. Electronically Signed   By: Iven Finn M.D.   On: 11/18/2021 16:12    ____________________________________________   PROCEDURES  Procedure(s) performed (including Critical Care): Rickel care time 30 minutes.  This includes taking the patient's history out in the lobby bringing her back to the room myself working her up onto the pulse ox starting her IV myself drawing some more blood on her myself then of  course reviewing her old records and speaking to the hospitalist.  I also of course reviewed her lab work.  Procedures   ____________________________________________   INITIAL IMPRESSION / ASSESSMENT AND PLAN / ED COURSE  Patient with the flu.  There are currently no beds to put her in.  We will keep her on oxygen I have added a couple lab tests to her and a DuoNeb.  If we can make her own hypoxic we will try to discharge her on Tamiflu otherwise we will plan on admitting her.            ____________________________________________   FINAL CLINICAL IMPRESSION(S) / ED DIAGNOSES  Final diagnoses:  Influenza  Hypoxia     ED Discharge Orders     None        Note:  This document was prepared using Dragon voice recognition software and may include unintentional dictation errors.    Nena Polio, MD 11/18/21 1918    Nena Polio, MD 11/18/21 2206

## 2021-11-18 NOTE — ED Notes (Signed)
Pt transported to CT at this time.

## 2021-11-18 NOTE — H&P (Signed)
History and Physical    Evelyn Estrada GDJ:[id number] DOB: 1981-12-21 DOA: 11/18/2021  PCP: [facility] [place]    Patient coming from:  Home    Chief Complaint:  Cough, fever, myalgia, headache   HPI:  Evelyn Estrada is a 40 y.o. female seen in ed with complaints of myalgias aches fevers headaches ear pain on the left ear cough congestion all since Wednesday.  As her symptoms progressed and got worse patient came to the emergency room today.  He also reports malodorous urine and a history of frequent UTI with recurrent UTI after having sexual intercourse.  Patient describes the left ear pain as 8/10, throbbing, nonradiating.patient also reports some nausea and diarrhea otherwise no chest pain blurred vision gait or speech issues palpitations shortness of breath.  Pt has past medical history of tobacco abuse, depression, hypertension, allergies to Phenergan.  ED Course:  Vitals:   11/18/21 1528 11/18/21 1529 11/18/21 2100 11/18/21 2145  BP: (!) 157/104  (!) 144/95 (!) 148/131  Pulse: (!) 116  96 90  Resp: 20     Temp: 99.6 F (37.6 C)     TempSrc: Oral     SpO2: (!) 89%  98% 97%  Weight:  (!) 142.9 kg    Height:  6' (1.829 m)    The emergency room patient meets SIRS criteria with tachycardia respiratory rate and also has a low-grade temperature of 99.6, patient found to be hypoxic on room air at 89% and started on 2 L nasal cannula.  BMP shows sodium of 133 glucose of 127, calcium of 8.3, normal BNP and troponin, procalcitonin less than 10, CBC shows normal white count of 8.6 hemoglobin of 13.3 and platelet count of 318.  Respiratory panel positive for influenza A.  Negative for COVID.  Chest x-ray shows mild pulmonary edema versus viral disease.   Review of Systems:  Review of Systems  Constitutional:  Positive for chills, fever and malaise/fatigue.  Respiratory:  Positive for cough, shortness of breath and wheezing.   All other systems reviewed  and are negative.   Past Medical History:  Diagnosis Date   Chronic joint pain    Depression    History of kidney stones    several times   Hypertension     Past Surgical History:  Procedure Laterality Date   COLONOSCOPY WITH PROPOFOL N/A 05/30/2020   Procedure: COLONOSCOPY WITH PROPOFOL;  Surgeon: Lin Landsman, MD;  Location[facility] ENDOSCOPY;  Service: Gastroenterology;  Laterality: N/A;   EXTRACORPOREAL SHOCK WAVE LITHOTRIPSY Right 11/29/2016   Procedure: EXTRACORPOREAL SHOCK WAVE LITHOTRIPSY (ESWL);  Surgeon: Royston Cowper, MD;  Location: [facility] ORS;  Service: Urology;  Laterality: Right;   TUBAL LIGATION  2002     reports that she quit smoking about 20 years ago. Her smoking use included cigarettes. She has never used smokeless tobacco. She reports that she does not drink alcohol and does not use drugs.  Allergies  Allergen Reactions   Phenergan [Promethazine Hcl] Rash    Family History  Problem Relation Age of Onset   Arthritis/Rheumatoid Mother    Prostate cancer Father     Prior to Admission medications   Medication Sig Start Date End Date Taking? Authorizing Provider  dicyclomine (BENTYL) 10 MG capsule TAKE 1 CAPSULE BY MOUTH THREE TIMES DAILY BEFORE MEALS AND 1 CAPSULE AT BEDTIME. 11/09/20   Vanga, Tally Due, MD  escitalopram (LEXAPRO) 10 MG tablet Take 10 mg by mouth daily.    [provider]  hydrochlorothiazide (HYDRODIURIL) 25 MG tablet Take 25 mg by mouth daily. 12/21/19   [provider]  hydrOXYzine (ATARAX/VISTARIL) 10 MG tablet Take 10 mg by mouth 3 (three) times daily. 11/17/19   [provider]  losartan (COZAAR) 50 MG tablet Take 50 mg by mouth daily. 12/21/19   [provider]  Meth-Hyo-M Bl-Na Phos-Ph Sal (URIBEL) [place]  12/30/19   [provider]  norgestimate-ethinyl estradiol (ORTHO-CYCLEN,SPRINTEC,PREVIFEM) 0.25-35 MG-MCG tablet Take 1 tablet by mouth daily. 08/04/18   Rod Can, CNM     Physical Exam: Vitals:   11/18/21 1528 11/18/21 1529 11/18/21 2100 11/18/21 2145  BP: (!) 157/104  (!) 144/95 (!) 148/131  Pulse: (!) 116  96 90  Resp: 20     Temp: 99.6 F (37.6 C)     TempSrc: Oral     SpO2: (!) 89%  98% 97%  Weight:  (!) 142.9 kg    Height:  6' (1.829 m)     Physical Exam Vitals and nursing note reviewed.  Constitutional:      Appearance: She is obese. She is ill-appearing.  HENT:     Head: Normocephalic and atraumatic.     Right Ear: External ear normal.     Left Ear: External ear normal.     Mouth/Throat:     Mouth: Mucous membranes are moist.  Eyes:     Extraocular Movements: Extraocular movements intact.     Pupils: Pupils are equal, round, and reactive to light.  Cardiovascular:     Rate and Rhythm: Normal rate and regular rhythm.     Heart sounds: Normal heart sounds.  Pulmonary:     Effort: Pulmonary effort is normal.     Breath sounds: Normal breath sounds.  Abdominal:     General: Bowel sounds are normal. There is no distension.     Palpations: Abdomen is soft.     Tenderness: There is no abdominal tenderness.  Musculoskeletal:        General: No swelling.     Cervical back: Normal range of motion and neck supple.     Right lower leg: No edema.     Left lower leg: No edema.  Skin:    General: Skin is warm and dry.  Neurological:     General: No focal deficit present.     Mental Status: She is alert and oriented to person, place, and time.     Cranial Nerves: No cranial nerve deficit or dysarthria.  Psychiatric:        Mood and Affect: Mood normal.        Behavior: Behavior normal.   Labs on Admission: I have personally reviewed following labs and imaging studies  No results for input(s): CKTOTAL, CKMB, TROPONINI in the last 72 hours. Lab Results  Component Value Date   WBC 8.6 11/18/2021   HGB 13.3 11/18/2021   HCT 40.5 11/18/2021   MCV 91.8 11/18/2021   PLT 318 11/18/2021    Recent Labs  Lab 11/18/21 1536  NA 133*   K 3.5  CL 105  CO2 20*  BUN 12  CREATININE 0.54  CALCIUM 8.3*  GLUCOSE 127*   No results found for: CHOL, HDL, LDLCALC, TRIG No results found for: DDIMER Invalid input(s): POCBNP   COVID-19 Labs No results for input(s): DDIMER, FERRITIN, LDH, CRP in the last 72 hours. Lab Results  Component Value Date   [place] 11/18/2021   [place] NEGATIVE 05/26/2020    Radiological Exams  on Admission: DG Chest 2 View  Result Date: 11/18/2021 CLINICAL DATA:  Shortness of breath EXAM: CHEST - 2 VIEW COMPARISON:  None. FINDINGS: The heart and mediastinal contours are within normal limits. Streaky airspace opacities at the left base. No focal consolidation. Slightly increased interstitial markings. No pleural effusion. No pneumothorax. No acute osseous abnormality. IMPRESSION: 1. Mild pulmonary edema versus viral disease. 2. Streaky airspace opacities at the left base likely representing atelectasis. Electronically Signed   By: Iven Finn M.D.   On: 11/18/2021 16:12    EKG: Independently reviewed.  Sinus tach 115 with Q waves in lead V2, II and V3.  Assessment/Plan: Principal Problem:   Upper respiratory symptom Active Problems:   Respiratory failure with hypoxia (HCC)   Influenza A   Essential hypertension   Generalized anxiety disorder   Upper respiratory symptoms/respiratory failure with hypoxia/influenza A: Attribute patient's hypoxia cough and upper respiratory symptoms due to influenza A: Start patient on Tamiflu and admit patient to medical telemetry unit with continuous cardiac monitoring.  We will also start albuterol MDIs as needed. Supplemental oxygen for goal O2 sats 90% and above. Low threshold for CT chest if hypoxia persists or worsens or does not resolve or if D-dimer is elevated as patient is on birth control. Ambulatory pulse oximetry prior to discharge. Supportive care with IV fluids, antipyretics, antiemetics.  And IV  fluid.   Hypertension: Blood pressure (!) 146/93, pulse 100, temperature 99.6 F (37.6 C), temperature source Oral, resp. rate 20, height 6' (1.829 m), weight (!) 142.9 kg, SpO2 98 %. We will continue patient's HCTZ, Cozaar.  Elevated glucose of 127: Hyperglycemia Obesity we will check for A1c.  DVT prophylaxis:  Heparin  Code Status:  Full code  Family Communication:  Lundquist, josh (Spouse)  [phone] (Mobile)   Disposition Plan:  Home  Consults called:  None  Admission status: Inpatient   Para Skeans MD [facility] [phone] How to contact the [facility] Attending or Consulting provider [place] or covering provider during after hours [place], for this patient.    Check the care team in [facility] and look for a) attending/consulting [facility] provider listed and b) the [facility] team listed Log into [email or web address] and use [place]'s universal password to access. If you do not have the password, please contact the hospital operator. Locate the [facility] provider you are looking for under [facility] and page to a number that you can be directly reached. If you still have difficulty reaching the provider, please page the [facility] (Director on Call) for the Hospitalists listed on amion for assistance. [email or web address] Password [facility] 11/18/2021, 9:55 PM

## 2021-11-18 NOTE — ED Triage Notes (Signed)
Pt via POV from home. Pt c/o body aches, headache, cough, nasal congestion, and SOB. Pt also states that her urine has been dark and burning when she pees. Pt is A&OX4 and NAD.

## 2021-11-18 NOTE — ED Notes (Signed)
PT ambulated to BR with spo2 sat of 97 when returned. PT endorsing "I dont feel good"

## 2021-11-19 DIAGNOSIS — J9601 Acute respiratory failure with hypoxia: Secondary | ICD-10-CM | POA: Diagnosis present

## 2021-11-19 LAB — COMPREHENSIVE METABOLIC PANEL
ALT: 39 U/L (ref 0–44)
AST: 50 U/L — ABNORMAL HIGH (ref 15–41)
Albumin: 3.1 g/dL — ABNORMAL LOW (ref 3.5–5.0)
Alkaline Phosphatase: 51 U/L (ref 38–126)
Anion gap: 8 (ref 5–15)
BUN: 11 mg/dL (ref 6–20)
CO2: 20 mmol/L — ABNORMAL LOW (ref 22–32)
Calcium: 7.9 mg/dL — ABNORMAL LOW (ref 8.9–10.3)
Chloride: 105 mmol/L (ref 98–111)
Creatinine, Ser: 0.56 mg/dL (ref 0.44–1.00)
GFR, Estimated: >60 mL/min (ref >60)
Glucose, Bld: 140 mg/dL — ABNORMAL HIGH (ref 70–99)
Potassium: 3 mmol/L — ABNORMAL LOW (ref 3.5–5.1)
Sodium: 133 mmol/L — ABNORMAL LOW (ref 135–145)
Total Bilirubin: 0.4 mg/dL (ref 0.3–1.2)
Total Protein: 6.9 g/dL (ref 6.5–8.1)

## 2021-11-19 LAB — CBC WITH DIFFERENTIAL/PLATELET
Abs Immature Granulocytes: 0.05 10*3/uL (ref 0.00–0.07)
Basophils Absolute: 0 10*3/uL (ref 0.0–0.1)
Basophils Relative: 0 %
Eosinophils Absolute: 0 10*3/uL (ref 0.0–0.5)
Eosinophils Relative: 0 %
HCT: 36.3 % (ref 36.0–46.0)
Hemoglobin: 11.9 g/dL — ABNORMAL LOW (ref 12.0–15.0)
Immature Granulocytes: 1 %
Lymphocytes Relative: 21 %
Lymphs Abs: 1.6 10*3/uL (ref 0.7–4.0)
MCH: 30.3 pg (ref 26.0–34.0)
MCHC: 32.8 g/dL (ref 30.0–36.0)
MCV: 92.4 fL (ref 80.0–100.0)
Monocytes Absolute: 0.8 10*3/uL (ref 0.1–1.0)
Monocytes Relative: 10 %
Neutro Abs: 5 10*3/uL (ref 1.7–7.7)
Neutrophils Relative %: 68 %
Platelets: 283 10*3/uL (ref 150–400)
RBC: 3.93 MIL/uL (ref 3.87–5.11)
RDW: 13.8 % (ref 11.5–15.5)
WBC: 7.5 10*3/uL (ref 4.0–10.5)
nRBC: 0 % (ref 0.0–0.2)

## 2021-11-19 LAB — STREP PNEUMONIAE URINARY ANTIGEN: Strep Pneumo Urinary Antigen: NEGATIVE

## 2021-11-19 LAB — HIV ANTIBODY (ROUTINE TESTING W REFLEX): HIV Screen 4th Generation wRfx: NONREACTIVE

## 2021-11-19 LAB — D-DIMER, QUANTITATIVE: D-Dimer, Quant: 0.54 ug/mL-FEU — ABNORMAL HIGH (ref 0.00–0.50)

## 2021-11-19 MED ORDER — GUAIFENESIN ER 600 MG PO TB12: 600.0000 mg | ORAL_TABLET | Freq: Two times a day (BID) | ORAL | Status: DC | PRN

## 2021-11-19 MED ORDER — ACETAMINOPHEN 325 MG PO TABS
ORAL_TABLET | ORAL | Status: AC
  Filled 2021-11-19: qty 1

## 2021-11-19 MED ORDER — SODIUM CHLORIDE 0.9 % IV SOLN: INTRAVENOUS | Status: DC

## 2021-11-19 MED ORDER — ALBUTEROL SULFATE (2.5 MG/3ML) 0.083% IN NEBU: 2.5000 mg | INHALATION_SOLUTION | RESPIRATORY_TRACT | Status: DC | PRN

## 2021-11-19 MED ORDER — SODIUM CHLORIDE 0.9 % IV SOLN
2.0000 g | INTRAVENOUS | Status: DC
  Administered 2021-11-19: 01:00:00 2 g via INTRAVENOUS
  Filled 2021-11-19: qty 20

## 2021-11-19 MED ORDER — HYDROCHLOROTHIAZIDE 25 MG PO TABS
25.0000 mg | ORAL_TABLET | Freq: Every day | ORAL | Status: DC
  Administered 2021-11-19: 10:00:00 25 mg via ORAL
  Filled 2021-11-19: qty 1

## 2021-11-19 MED ORDER — OSELTAMIVIR PHOSPHATE 75 MG PO CAPS
75.0000 mg | ORAL_CAPSULE | Freq: Two times a day (BID) | ORAL | 0 refills | Status: AC
Start: 2021-11-19 — End: 2021-11-23

## 2021-11-19 MED ORDER — ACETAMINOPHEN 325 MG PO TABS: 650.0000 mg | ORAL_TABLET | Freq: Four times a day (QID) | ORAL | Status: DC | PRN

## 2021-11-19 MED ORDER — ACETAMINOPHEN 650 MG RE SUPP: 650.0000 mg | Freq: Four times a day (QID) | RECTAL | Status: DC | PRN

## 2021-11-19 MED ORDER — POTASSIUM CHLORIDE CRYS ER 20 MEQ PO TBCR
40.0000 meq | EXTENDED_RELEASE_TABLET | Freq: Once | ORAL | Status: AC
  Administered 2021-11-19: 13:00:00 40 meq via ORAL
  Filled 2021-11-19: qty 2

## 2021-11-19 MED ORDER — SODIUM CHLORIDE 0.9 % IV SOLN
500.0000 mg | INTRAVENOUS | Status: DC
  Administered 2021-11-19: 02:00:00 500 mg via INTRAVENOUS
  Filled 2021-11-19 (×2): qty 500

## 2021-11-19 MED ORDER — BISACODYL 5 MG PO TBEC: 5.0000 mg | DELAYED_RELEASE_TABLET | Freq: Every day | ORAL | Status: DC | PRN

## 2021-11-19 MED ORDER — MORPHINE SULFATE (PF) 2 MG/ML IV SOLN: 2.0000 mg | INTRAVENOUS | Status: DC | PRN

## 2021-11-19 MED ORDER — DOCUSATE SODIUM 100 MG PO CAPS
100.0000 mg | ORAL_CAPSULE | Freq: Two times a day (BID) | ORAL | Status: DC
  Filled 2021-11-19: qty 1

## 2021-11-19 MED ORDER — POLYETHYLENE GLYCOL 3350 17 G PO PACK: 17.0000 g | PACK | Freq: Every day | ORAL | Status: DC | PRN

## 2021-11-19 MED ORDER — LOSARTAN POTASSIUM 50 MG PO TABS
50.0000 mg | ORAL_TABLET | Freq: Every day | ORAL | Status: DC
  Administered 2021-11-19: 10:00:00 50 mg via ORAL
  Filled 2021-11-19: qty 1

## 2021-11-19 MED ORDER — ALBUTEROL SULFATE HFA 108 (90 BASE) MCG/ACT IN AERS: 2.0000 | INHALATION_SPRAY | Freq: Four times a day (QID) | RESPIRATORY_TRACT | 2 refills | Status: DC | PRN

## 2021-11-19 MED ORDER — HYDROCODONE-ACETAMINOPHEN 5-325 MG PO TABS: 1.0000 | ORAL_TABLET | ORAL | Status: DC | PRN

## 2021-11-19 NOTE — Progress Notes (Signed)
PT Cancellation Note  Patient Details Name: Evelyn Estrada MRN: [id number] DOB: 01/27/1981   Cancelled Treatment:    Reason Eval/Treat Not Completed: PT screened, no needs identified, will sign off Orders received, chart reviewed. Per chart pt has been ambulatory without assistance. Spoke with pt who reports she's moving well, albeit just a little slower "because I'm sick". No acute PT needs at this time, will sign off. Please re-consult if new needs arise. Thank you.  Lavone Nian, PT, DPT 11/19/21, 8:51 AM    Waunita Schooner 11/19/2021, 8:50 AM

## 2021-11-19 NOTE — ED Notes (Signed)
Medication administered as ordered. Pt tolerated well. Pt states is now progressing to a productive cough at this time. Pt denies further needs, call bell remains within reach of patient at this time.

## 2021-11-19 NOTE — Progress Notes (Signed)
OT Cancellation Note  Patient Details Name: Evelyn Estrada MRN: [id number] DOB: [birth year]   Cancelled Treatment:    Reason Eval/Treat Not Completed: OT screened, no needs identified, will sign off. Order received, chart reviewed. Per chart review, pt back to baseline functional independence. No skilled OT needs identified. Will sign off. Please re-consult if additional needs arise.    Fredirick Maudlin, OTR/L [place]

## 2021-11-19 NOTE — ED Notes (Signed)
Pt titrated to RA. Denies any shob at this time. Will ambulate patient and report to MD about ambulatory trial o2 sat.

## 2021-11-19 NOTE — Discharge Summary (Signed)
Physician Discharge Summary  Evelyn Estrada AOZ:[id number] DOB: 11-15-1981 DOA: 11/18/2021  PCP: [facility] [place] date: 11/18/2021 Discharge date: 11/19/2021  Time spent: 40 minutes  Recommendations for Outpatient Follow-up:  Follow outpatient CBC/CMP Follow symptoms outpatient Follow CXR outpatient Follow fatty liver outpatient Follow A1c outpatient  Follow OCP use outpatient - risks/benefits eval   Discharge Diagnoses:  Principal Problem:   Upper respiratory symptom Active Problems:   Essential hypertension   Generalized anxiety disorder   Respiratory failure with hypoxia (Evelyn Estrada)   Influenza Evelyn Estrada   Acute respiratory failure with hypoxia (Evelyn Estrada)   Discharge Condition: stable  Diet recommendation: heart healthy  Filed Weights   11/18/21 1529  Weight: (!) 142.9 kg    History of present illness:  40 yo with hx HTN, depression, joint pain, hypertension who presented with influenza Evelyn Estrada and hypoxia.  She's improved on hospital day 1.    Plan for discharge with oral antiviral therapy.   Hospital Course:  Acute Hypoxic Respiratory Failure 2/2 Influenza CT without PE, mild to moderate bibasilar scarring and/or atelectasis She's doing well on RA today, will discharge on tamiflu with inhaler  Elevated D dimer CT PE protocol without PE  Elevated BG A1c pending, discussed recommendation to follow with PCP outpatient  Fatty Liver Follow with PCP outpatient   Procedures: none  Consultations: none  Discharge Exam: Vitals:   11/19/21 1146 11/19/21 1206  BP:  139/89  Pulse:  80  Resp:  20  Temp:    SpO2: 95% 96%   Feeling better Husband at bedside Discussed follow up recs - a1c, fatty liver, etc  General: No acute distress. Cardiovascular: Heart sounds show Evelyn Estrada regular rate, and rhythm. Lungs: Clear to auscultation bilaterally  Abdomen: Soft, nontender, nondistended  Neurological: Alert and oriented 3. Moves all extremities 4 .  Cranial nerves II through XII grossly intact. Skin: Warm and dry. No rashes or lesions. Extremities: No clubbing or cyanosis. No edema.  Discharge Instructions   Discharge Instructions     Call MD for:  difficulty breathing, headache or visual disturbances   Complete by: As directed    Call MD for:  extreme fatigue   Complete by: As directed    Call MD for:  hives   Complete by: As directed    Call MD for:  persistant dizziness or light-headedness   Complete by: As directed    Call MD for:  persistant nausea and vomiting   Complete by: As directed    Call MD for:  redness, tenderness, or signs of infection (pain, swelling, redness, odor or green/yellow discharge around incision site)   Complete by: As directed    Call MD for:  severe uncontrolled pain   Complete by: As directed    Call MD for:  temperature >100.4   Complete by: As directed    Diet - Estrada sodium heart healthy   Complete by: As directed    Discharge instructions   Complete by: As directed    You were seen for influenza.  You've improved on tamiflu.  I don't think you need additional antibiotics for other possible infections at this time.    We'll send you home with tamiflu and an albuterol inhaler.  Your blood sugars were high here.  We have an A1c pending.  You should follow this up with your PCP outpatient.    Your CT showed fatty liver disease.  You should follow this with your PCP.  Return for new, recurrent,  or worsening symptoms.  Please ask your PCP to request records from this hospitalization so they know what was done and what the next steps will be.   Increase activity slowly   Complete by: As directed       Allergies as of 11/19/2021       Reactions   Phenergan [promethazine Hcl] Rash        Medication List     TAKE these medications    albuterol 108 (90 Base) MCG/ACT inhaler Commonly known as: VENTOLIN HFA Inhale 2 puffs into the lungs every 6 (six) hours as needed for wheezing  or shortness of breath.   dicyclomine 10 MG capsule Commonly known as: BENTYL TAKE 1 CAPSULE BY MOUTH THREE TIMES DAILY BEFORE MEALS AND 1 CAPSULE AT BEDTIME.   escitalopram 10 MG tablet Commonly known as: LEXAPRO Take 15 mg by mouth daily.   hydrochlorothiazide 25 MG tablet Commonly known as: HYDRODIURIL Take 25 mg by mouth daily.   hydrOXYzine 10 MG tablet Commonly known as: ATARAX/VISTARIL Take 10 mg by mouth 3 (three) times daily.   losartan 50 MG tablet Commonly known as: COZAAR Take 50 mg by mouth daily.   norgestimate-ethinyl estradiol 0.25-35 MG-MCG tablet Commonly known as: ORTHO-CYCLEN Take 1 tablet by mouth daily.   oseltamivir 75 MG capsule Commonly known as: TAMIFLU Take 1 capsule (75 mg total) by mouth 2 (two) times daily for 4 days.   Uribel 118 MG Caps       Allergies  Allergen Reactions   Phenergan [Promethazine Hcl] Rash      The results of significant diagnostics from this hospitalization (including imaging, microbiology, ancillary and laboratory) are listed below for reference.    Significant Diagnostic Studies: DG Chest 2 View  Result Date: 11/18/2021 CLINICAL DATA:  Shortness of breath EXAM: CHEST - 2 VIEW COMPARISON:  None. FINDINGS: The heart and mediastinal contours are within normal limits. Streaky airspace opacities at the left base. No focal consolidation. Slightly increased interstitial markings. No pleural effusion. No pneumothorax. No acute osseous abnormality. IMPRESSION: 1. Mild pulmonary edema versus viral disease. 2. Streaky airspace opacities at the left base likely representing atelectasis. Electronically Signed   By: Iven Finn M.D.   On: 11/18/2021 16:12   CT Angio Chest Pulmonary Embolism (PE) W or WO Contrast  Result Date: 11/19/2021 CLINICAL DATA:  Fever, cough and body aches. EXAM: CT ANGIOGRAPHY CHEST WITH CONTRAST TECHNIQUE: Multidetector CT imaging of the chest was performed using the standard protocol during  bolus administration of intravenous contrast. Multiplanar CT image reconstructions and MIPs were obtained to evaluate the vascular anatomy. CONTRAST:  147mL OMNIPAQUE IOHEXOL 350 MG/ML SOLN COMPARISON:  None. FINDINGS: Cardiovascular: Satisfactory opacification of the pulmonary arteries to the segmental level. No evidence of pulmonary embolism. Normal heart size. No pericardial effusion. Mediastinum/Nodes: No enlarged mediastinal, hilar, or axillary lymph nodes. Thyroid gland, trachea, and esophagus demonstrate no significant findings. Lungs/Pleura: Mild to moderate severity linear scarring and/or atelectasis is seen within the bilateral lung bases. There is no evidence of Caraline Deutschman pleural effusion or pneumothorax. Upper Abdomen: There is diffuse fatty infiltration of the liver parenchyma. Musculoskeletal: No chest wall abnormality. No acute or significant osseous findings. Review of the MIP images confirms the above findings. IMPRESSION: 1. No CT evidence of pulmonary embolism. 2. Mild to moderate severity bibasilar linear scarring and/or atelectasis. 3. Fatty liver. Electronically Signed   By: Virgina Norfolk M.D.   On: 11/19/2021 00:08    Microbiology: Recent Results (from the past  240 hour(s))  Resp Panel by RT-PCR (Flu Press [place]&B, Covid) Nasopharyngeal Swab     Status: Abnormal   Collection Time: 11/18/21  3:36 PM   Specimen: Nasopharyngeal Swab; Nasopharyngeal(NP) swabs in vial transport medium  Result Value Ref Range Status   SARS Coronavirus 2 by RT PCR NEGATIVE NEGATIVE Final    Comment: (NOTE) SARS-CoV-2 target nucleic acids are NOT DETECTED.  The SARS-CoV-2 RNA is generally detectable in upper respiratory specimens during the acute phase of infection. The lowest concentration of SARS-CoV-2 viral copies this assay can detect is 138 copies/mL. Artie Takayama negative result does not preclude SARS-Cov-2 infection and should not be used as the sole basis for treatment or other patient management decisions. Hermila Millis negative  result may occur with  improper specimen collection/handling, submission of specimen other than nasopharyngeal swab, presence of viral mutation(s) within the areas targeted by this assay, and inadequate number of viral copies(<138 copies/mL). Taquanna Borras negative result must be combined with clinical observations, patient history, and epidemiological information. The expected result is Negative.  Fact Sheet for Patients:  EntrepreneurPulse.com.au  Fact Sheet for Healthcare Providers:  IncredibleEmployment.be  This test is no t yet approved or cleared by the [place] FDA and  has been authorized for detection and/or diagnosis of SARS-CoV-2 by FDA under an Emergency Use Authorization (EUA). This EUA will remain  in effect (meaning this test can be used) for the duration of the COVID-19 declaration under Section 564(b)(1) of the Act, 21 U.S.C.section 360bbb-3(b)(1), unless the authorization is terminated  or revoked sooner.       Influenza Lorena Clearman by PCR POSITIVE (Wilbert Schouten) NEGATIVE Final   Influenza B by PCR NEGATIVE NEGATIVE Final    Comment: (NOTE) The Xpert Xpress SARS-CoV-2/FLU/RSV plus assay is intended as an aid in the diagnosis of influenza from Nasopharyngeal swab specimens and should not be used as Itzia Cunliffe sole basis for treatment. Nasal washings and aspirates are unacceptable for Xpert Xpress SARS-CoV-2/FLU/RSV testing.  Fact Sheet for Patients: EntrepreneurPulse.com.au  Fact Sheet for Healthcare Providers: IncredibleEmployment.be  This test is not yet approved or cleared by the [place] FDA and has been authorized for detection and/or diagnosis of SARS-CoV-2 by FDA under an Emergency Use Authorization (EUA). This EUA will remain in effect (meaning this test can be used) for the duration of the COVID-19 declaration under Section 564(b)(1) of the Act, 21 U.S.C. section 360bbb-3(b)(1), unless the authorization is  terminated or revoked.  Performed at [facility], [address] [place], [place] [address]      Labs: Basic Metabolic Panel: Recent Labs  Lab 11/18/21 1536 11/19/21 0037  NA 133* 133*  K 3.5 3.0*  CL 105 105  CO2 20* 20*  GLUCOSE 127* 140*  BUN 12 11  CREATININE 0.54 0.56  CALCIUM 8.3* 7.9*   Liver Function Tests: Recent Labs  Lab 11/19/21 0037  AST 50*  ALT 39  ALKPHOS 51  BILITOT 0.4  PROT 6.9  ALBUMIN 3.1*   No results for input(s): LIPASE, AMYLASE in the last 168 hours. No results for input(s): AMMONIA in the last 168 hours. CBC: Recent Labs  Lab 11/18/21 1536 11/19/21 0037  WBC 8.6 7.5  NEUTROABS  --  5.0  HGB 13.3 11.9*  HCT 40.5 36.3  MCV 91.8 92.4  PLT 318 283   Cardiac Enzymes: No results for input(s): CKTOTAL, CKMB, CKMBINDEX, TROPONINI in the last 168 hours. BNP: BNP (last 3 results) Recent Labs    11/18/21 1536  BNP 31.0    ProBNP (  last 3 results) No results for input(s): PROBNP in the last 8760 hours.  CBG: No results for input(s): GLUCAP in the last 168 hours.     Signed:  Fayrene Helper MD.  [facility] 11/19/2021, 12:32 PM

## 2021-11-19 NOTE — ED Notes (Signed)
Pt notified this RN feeling nauseated after CT with contrast, nausea medication provided as prescribed. Pt alerted this RN had episode of urinary incontinence while vomiting, pt ambulatory to the bathroom without difficulty, this RN provided clean sheets and tidied room up while patient in the bathroom. Call bell within reach of patient. Pt denies further needs at this time.

## 2021-11-19 NOTE — ED Notes (Signed)
Dc ppw provided. Follow up care and rx information given. Pt ive removed. Pt denies any questions and provides verbal consent for DC. Pt assisted off unit on foot

## 2021-11-19 NOTE — ED Notes (Signed)
Ambulatory trial of 300 ft complete. Pt o2 sat stayed greater than 92 percent.

## 2021-11-19 NOTE — ED Notes (Signed)
Pt ambulate from room to restroom sats 96% room air.

## 2021-11-20 LAB — HEMOGLOBIN A1C
Hgb A1c MFr Bld: 6.5 % — ABNORMAL HIGH (ref 4.8–5.6)
Mean Plasma Glucose: 140 mg/dL

## 2022-01-25 ENCOUNTER — Emergency Department
Admission: EM | Admit: 2022-01-25 | Discharge: 2022-01-25 | Disposition: A | Discharge status: Home / Self Care | Payer: BC Managed Care – PPO | Attending: Emergency Medicine | Admitting: Emergency Medicine

## 2022-01-25 ENCOUNTER — Encounter: Payer: Self-pay | Admitting: Emergency Medicine

## 2022-01-25 ENCOUNTER — Other Ambulatory Visit: Payer: Self-pay

## 2022-01-25 ENCOUNTER — Emergency Department: Payer: BC Managed Care – PPO

## 2022-01-25 DIAGNOSIS — N132 Hydronephrosis with renal and ureteral calculous obstruction: Secondary | ICD-10-CM | POA: Diagnosis not present

## 2022-01-25 DIAGNOSIS — I10 Essential (primary) hypertension: Secondary | ICD-10-CM | POA: Diagnosis not present

## 2022-01-25 DIAGNOSIS — R109 Unspecified abdominal pain: Secondary | ICD-10-CM | POA: Diagnosis present

## 2022-01-25 DIAGNOSIS — N200 Calculus of kidney: Secondary | ICD-10-CM

## 2022-01-25 DIAGNOSIS — N12 Tubulo-interstitial nephritis, not specified as acute or chronic: Secondary | ICD-10-CM | POA: Insufficient documentation

## 2022-01-25 LAB — URINALYSIS, ROUTINE W REFLEX MICROSCOPIC
Glucose, UA: NEGATIVE mg/dL
Nitrite: NEGATIVE
Protein, ur: 100 mg/dL — AB
RBC / HPF: >50 RBC/hpf — ABNORMAL HIGH (ref 0–5)
Specific Gravity, Urine: >1.03 — ABNORMAL HIGH (ref 1.005–1.030)
WBC, UA: >50 WBC/hpf — ABNORMAL HIGH (ref 0–5)
pH: 5 (ref 5.0–8.0)

## 2022-01-25 LAB — BASIC METABOLIC PANEL
Anion gap: 8 (ref 5–15)
BUN: 14 mg/dL (ref 6–20)
CO2: 22 mmol/L (ref 22–32)
Calcium: 8.9 mg/dL (ref 8.9–10.3)
Chloride: 107 mmol/L (ref 98–111)
Creatinine, Ser: 0.85 mg/dL (ref 0.44–1.00)
GFR, Estimated: >60 mL/min (ref >60)
Glucose, Bld: 136 mg/dL — ABNORMAL HIGH (ref 70–99)
Potassium: 3.6 mmol/L (ref 3.5–5.1)
Sodium: 137 mmol/L (ref 135–145)

## 2022-01-25 LAB — CBC
HCT: 43 % (ref 36.0–46.0)
Hemoglobin: 14.1 g/dL (ref 12.0–15.0)
MCH: 30.5 pg (ref 26.0–34.0)
MCHC: 32.8 g/dL (ref 30.0–36.0)
MCV: 93.1 fL (ref 80.0–100.0)
Platelets: 361 10*3/uL (ref 150–400)
RBC: 4.62 MIL/uL (ref 3.87–5.11)
RDW: 13.2 % (ref 11.5–15.5)
WBC: 14.3 10*3/uL — ABNORMAL HIGH (ref 4.0–10.5)
nRBC: 0 % (ref 0.0–0.2)

## 2022-01-25 MED ORDER — SODIUM CHLORIDE 0.9 % IV BOLUS
1000.0000 mL | Freq: Once | INTRAVENOUS | Status: AC
  Administered 2022-01-25: 1000 mL via INTRAVENOUS

## 2022-01-25 MED ORDER — OXYCODONE-ACETAMINOPHEN 5-325 MG PO TABS
1.0000 | ORAL_TABLET | ORAL | Status: DC | PRN
  Administered 2022-01-25: 1 via ORAL
  Filled 2022-01-25: qty 1

## 2022-01-25 MED ORDER — CEFDINIR 300 MG PO CAPS: 300.0000 mg | ORAL_CAPSULE | Freq: Two times a day (BID) | ORAL | 0 refills | Status: AC

## 2022-01-25 MED ORDER — ONDANSETRON HCL 4 MG/2ML IJ SOLN
4.0000 mg | Freq: Once | INTRAMUSCULAR | Status: AC
Start: 2022-01-25 — End: 2022-01-25
  Administered 2022-01-25: 4 mg via INTRAVENOUS
  Filled 2022-01-25: qty 2

## 2022-01-25 MED ORDER — KETOROLAC TROMETHAMINE 30 MG/ML IJ SOLN
15.0000 mg | Freq: Once | INTRAMUSCULAR | Status: AC
  Administered 2022-01-25: 15 mg via INTRAVENOUS
  Filled 2022-01-25: qty 1

## 2022-01-25 MED ORDER — ONDANSETRON 4 MG PO TBDP
4.0000 mg | ORAL_TABLET | Freq: Once | ORAL | Status: AC
  Administered 2022-01-25: 4 mg via ORAL
  Filled 2022-01-25: qty 1

## 2022-01-25 MED ORDER — CEFTRIAXONE SODIUM 1 G IJ SOLR
1.0000 g | Freq: Once | INTRAMUSCULAR | Status: AC
  Administered 2022-01-25: 1 g via INTRAVENOUS
  Filled 2022-01-25: qty 10

## 2022-01-25 NOTE — ED Notes (Signed)
Pt teaching provided on medications that may cause drowsiness. Pt instructed not to drive or operate heavy machinery while taking the prescribed medication. Pt verbalized understanding.  ? ?Pt provided discharge instructions and prescription information. Pt was given the opportunity to ask questions and questions were answered. Discharge signature not obtained in the setting of the COVID-19 pandemic in order to reduce high touch surfaces.  ? ?

## 2022-01-25 NOTE — Discharge Instructions (Addendum)
IfYour CAT scan shows that you likely passed a kidney stone.  Does also look like you have a UTI.  Develop fevers or worsening pain, please return to the emergency department immediately.  Please take the antibiotic twice a day for the next 7 days.  I recommend that you follow-up with urology in about 1 week.

## 2022-01-25 NOTE — ED Provider Notes (Signed)
[facility] Provider Note    Event Date/Time   First MD Initiated Contact with Patient 01/25/22 1532     (approximate)   History   Flank Pain   HPI  Evelyn Estrada is a 41 y.o. female with past medical history of kidney stones, hypertension and depression who presents with right flank pain.  Symptoms started last night around 1 AM.  Pain is located in the right back radiating down to the right abdomen.  Has been coming and going.  Does feel like prior kidney stones.  She has not been able to urinate since this morning.  Went to her primary doctor today but was unable to urinate was told to come to the ER.  Has had nausea and vomiting.  Also with some diarrhea.  Chills but no fevers.    Past Medical History:  Diagnosis Date   Chronic joint pain    Depression    History of kidney stones    several times   Hypertension     Patient Active Problem List   Diagnosis Date Noted   Acute respiratory failure with hypoxia ([place]) 11/19/2021   Essential hypertension 11/18/2021   Generalized anxiety disorder 11/18/2021   Respiratory failure with hypoxia ([place]) 11/18/2021   Upper respiratory symptom 11/18/2021   Influenza A 11/18/2021   IBS (irritable bowel syndrome) 07/08/2020   Rectal bleeding      Physical Exam  Triage Vital Signs: ED Triage Vitals [01/25/22 1515]  Enc Vitals Group     BP      Pulse      Resp      Temp      Temp src      SpO2      Weight (!) 315 lb 0.6 oz (142.9 kg)     Height 6' (1.829 m)     Head Circumference      Peak Flow      Pain Score 10     Pain Loc      Pain Edu?      Excl. in [place]?     Most recent vital signs: Vitals:   01/25/22 1930 01/25/22 2000  BP: 127/86 110/67  Pulse: 75 75  Resp: 18 16  Temp: 98.2 F (36.8 C)   SpO2: 95% 95%     General: Awake, patient appears uncomfortable, actively retching CV:  Good peripheral perfusion.  Resp:  Normal effort.  Abd:  No distention.  Mild tenderness to  palpation in the right flank and right lower quadrant Neuro:             Awake, Alert, Oriented x 3  Other:     ED Results / Procedures / Treatments  Labs (all labs ordered are listed, but only abnormal results are displayed) Labs Reviewed  URINALYSIS, ROUTINE W REFLEX MICROSCOPIC - Abnormal; Notable for the following components:      Result Value   APPearance CLOUDY (*)    Specific Gravity, Urine >1.030 (*)    Hgb urine dipstick LARGE (*)    Bilirubin Urine SMALL (*)    Ketones, ur TRACE (*)    Protein, ur 100 (*)    Leukocytes,Ua TRACE (*)    RBC / HPF >50 (*)    WBC, UA >50 (*)    Bacteria, UA MANY (*)    All other components within normal limits  BASIC METABOLIC PANEL - Abnormal; Notable for the following components:   Glucose, Bld 136 (*)    All  other components within normal limits  CBC - Abnormal; Notable for the following components:   WBC 14.3 (*)    All other components within normal limits  URINE CULTURE  POC URINE PREG, ED     EKG     RADIOLOGY CT reviewed by myself shows a 4 mm stone that is now in the bladder with some right-sided hydroureter but no obstructing stone   PROCEDURES:  Critical Care performed: No    MEDICATIONS ORDERED IN ED: Medications  oxyCODONE-acetaminophen (PERCOCET/ROXICET) 5-325 MG per tablet 1 tablet (1 tablet Oral Given 01/25/22 1520)  ondansetron (ZOFRAN-ODT) disintegrating tablet 4 mg (4 mg Oral Given 01/25/22 1519)  ketorolac (TORADOL) 30 MG/ML injection 15 mg (15 mg Intravenous Given 01/25/22 1710)  ondansetron (ZOFRAN) injection 4 mg (4 mg Intravenous Given 01/25/22 1710)  sodium chloride 0.9 % bolus 1,000 mL (0 mLs Intravenous Stopped 01/25/22 2024)  cefTRIAXone (ROCEPHIN) 1 g in sodium chloride 0.9 % 100 mL IVPB (0 g Intravenous Stopped 01/25/22 1913)     IMPRESSION / MDM / ASSESSMENT AND PLAN / ED COURSE  I reviewed the triage vital signs and the nursing notes.                              Differential diagnosis  includes, but is not limited to, nephrolithiasis, pyelonephritis, appendicitis  Patient is a 41 year old female with a history of kidney stones who presents with right flank pain nausea vomiting and difficulty urinating since 1 AM this morning.  On arrival to the ED she appears uncomfortable and is retching.  She has some tenderness not really in the CVA region but lower down in the back and then some in the right upper and right lower quadrant.  I reviewed her labs which are notable for leukocytosis to 14 but are otherwise reassuring.  She is still pending her urine.  She was given a Percocet and p.o. Zofran in triage, will give Toradol Zofran and fluids to help her provide a urine sample.  I suspect this is a kidney stone but appendicitis also on the differential.  Patient is obese so we should be able to get a look at the appendix with a CAT scan without contrast.  Patient CT shows hydroureter and hydronephrosis with some perinephric stranding on the right but no stone and the stone in the bladder likely indicating recently passed stone.  On repeat evaluation patient's pain is improving.  UA does have significant WBCs I suspect that she does have infection but with no obstruction now there is no indication for urgent intervention.  She was given a dose of Rocephin.  Ultimately she appeared well throughout her ED stay with improving pain tolerating p.o. normal vital signs.  Will discharge with 7 days of cefdinir.  Return precautions for fever or worsening pain.  She was given urology follow-up.   FINAL CLINICAL IMPRESSION(S) / ED DIAGNOSES   Final diagnoses:  Nephrolithiasis  Pyelonephritis     Rx / DC Orders   ED Discharge Orders          Ordered    cefdinir (OMNICEF) 300 MG capsule  2 times daily        01/25/22 1952             Note:  This document was prepared using Dragon voice recognition software and may include unintentional dictation errors.   Rada Hay,  MD 01/25/22 2300

## 2022-01-25 NOTE — ED Triage Notes (Signed)
Pt comes into the ED via POV c/o right flank pain that started this morning.  Pt has a h/o kidney stones and states this feels the same.  N/V present.  Pt presents uncomfortable at this time.

## 2022-01-27 LAB — URINE CULTURE

## 2022-04-26 ENCOUNTER — Other Ambulatory Visit (HOSPITAL_COMMUNITY): Payer: Self-pay | Admitting: Family Medicine

## 2022-04-26 ENCOUNTER — Other Ambulatory Visit: Payer: Self-pay | Admitting: Family Medicine

## 2022-04-26 DIAGNOSIS — R109 Unspecified abdominal pain: Secondary | ICD-10-CM

## 2022-04-30 ENCOUNTER — Ambulatory Visit
Admission: RE | Admit: 2022-04-30 | Discharge: 2022-04-30 | Disposition: A | Discharge status: Home / Self Care | Payer: BC Managed Care – PPO | Source: Ambulatory Visit | Attending: Family Medicine | Admitting: Family Medicine

## 2022-04-30 DIAGNOSIS — R109 Unspecified abdominal pain: Secondary | ICD-10-CM | POA: Diagnosis present

## 2022-08-07 ENCOUNTER — Other Ambulatory Visit (HOSPITAL_COMMUNITY)
Admission: RE | Admit: 2022-08-07 | Discharge: 2022-08-07 | Disposition: A | Discharge status: Home / Self Care | Payer: BC Managed Care – PPO | Source: Ambulatory Visit | Attending: Obstetrics & Gynecology | Admitting: Obstetrics & Gynecology

## 2022-08-07 ENCOUNTER — Ambulatory Visit: Payer: BC Managed Care – PPO | Admitting: Obstetrics & Gynecology

## 2022-08-07 DIAGNOSIS — Z124 Encounter for screening for malignant neoplasm of cervix: Secondary | ICD-10-CM | POA: Diagnosis present

## 2022-08-13 LAB — CYTOLOGY - PAP
Comment: NEGATIVE
Diagnosis: UNDETERMINED — AB
High risk HPV: NEGATIVE

## 2022-08-14 ENCOUNTER — Encounter: Payer: Self-pay | Admitting: Obstetrics and Gynecology

## 2022-08-23 ENCOUNTER — Encounter: Payer: Self-pay | Admitting: Obstetrics & Gynecology

## 2022-08-23 NOTE — Progress Notes (Signed)
Subjective:     Evelyn Estrada is a 41 y.o. woman who comes in today for a  pap smear only. She has been referred by her PCP for a repeat.  Pt recently had an abnormal PAP which we don't have the result for.  She knows she had some abnormal cells.   Contraception: OCP (estrogen/progesterone)  The following portions of the patient's history were reviewed and updated as appropriate: allergies, current medications, past family history, past medical history, past social history, past surgical history, and problem list.  Review of Systems A comprehensive review of systems was negative.   Objective:    There were no vitals taken for this visit. Pelvic Exam: cervix normal in appearance, external genitalia normal, and vagina normal without discharge. Pap smear obtained.   Assessment:    Follow up pap smear.   Plan:     Follow up  prn  , or as indicated by Pap results.   Rosario Adie, MD  08/23/2022 8:32 AM

## 2022-09-18 ENCOUNTER — Other Ambulatory Visit: Payer: Self-pay | Admitting: Nurse Practitioner

## 2022-09-18 DIAGNOSIS — Z1231 Encounter for screening mammogram for malignant neoplasm of breast: Secondary | ICD-10-CM

## 2022-12-14 IMAGING — CT CT ABD-PELV W/O CM
2 of 4 series · 16 of 46 positions shown, 18 images · non-contrast
Comparison: 09/02/2018

CLINICAL DATA: Evaluate for kidney stone.



[Series 2: axial st · axial · 0.98mm/px · z∈[-1078,-503]mm · 13 of 125 slices shown, 15 images]
[im 5/125  soft-tissue]
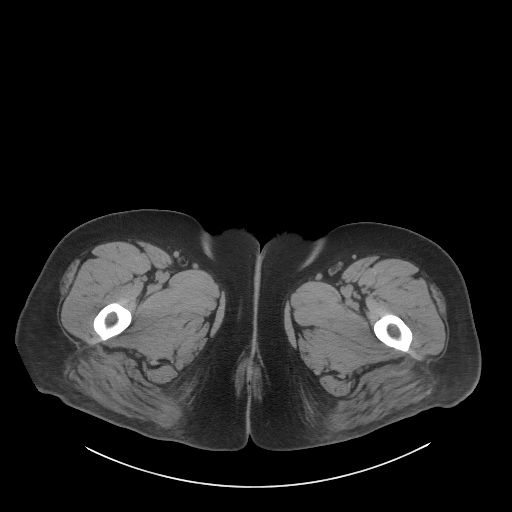
[im 5/125  bone]
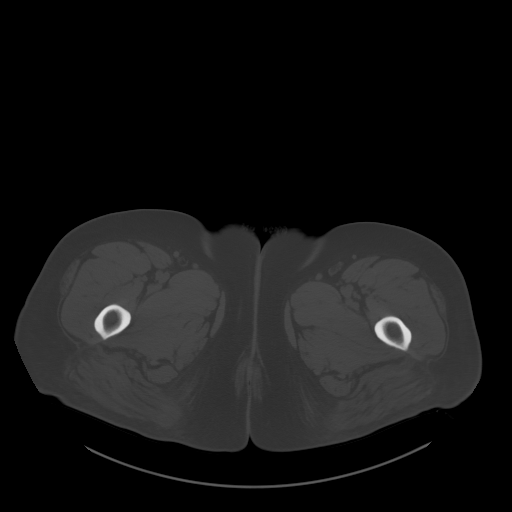
[im 15/125  soft-tissue]
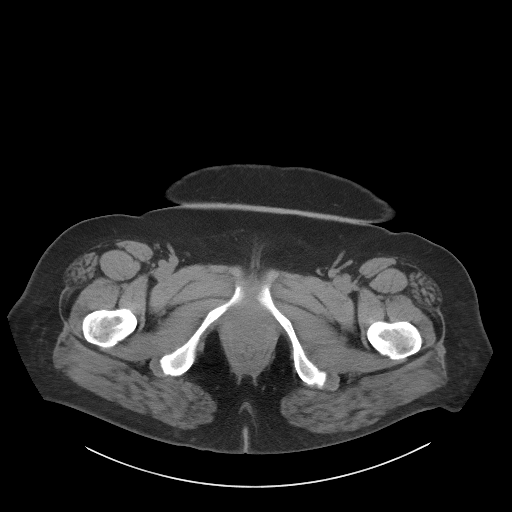
[im 25/125  soft-tissue]
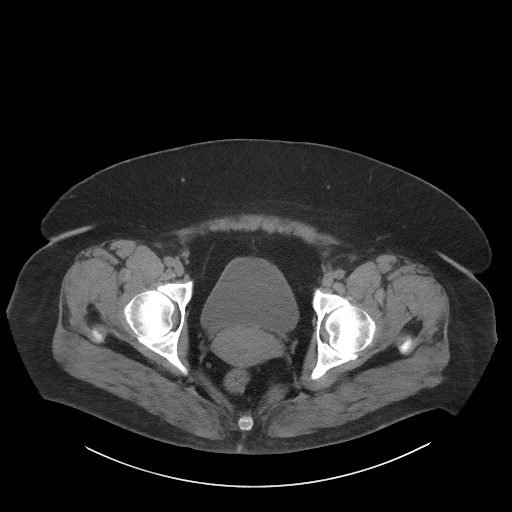
[im 35/125  soft-tissue]
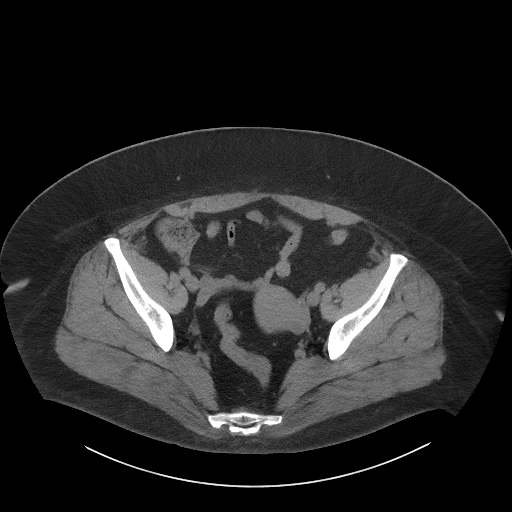
[im 45/125  soft-tissue]
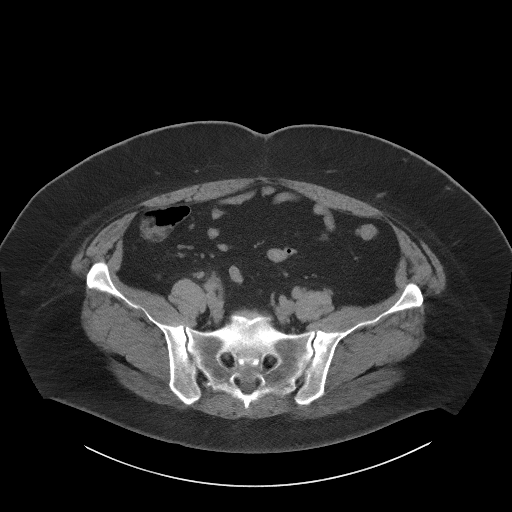
[im 55/125  soft-tissue]
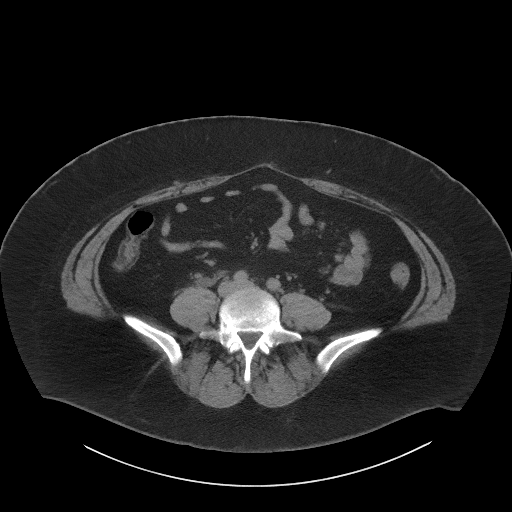
[im 65/125  soft-tissue]
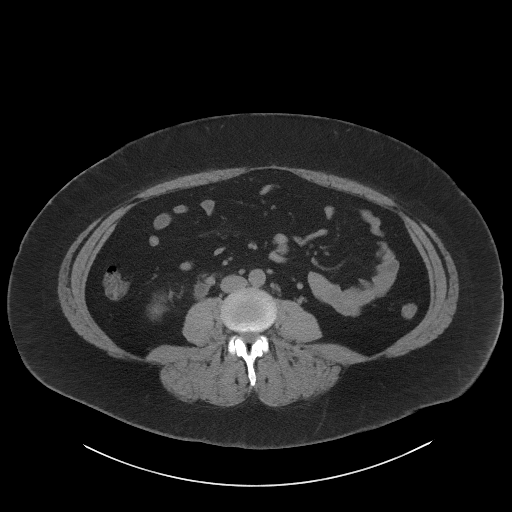
[im 70/125  soft-tissue]
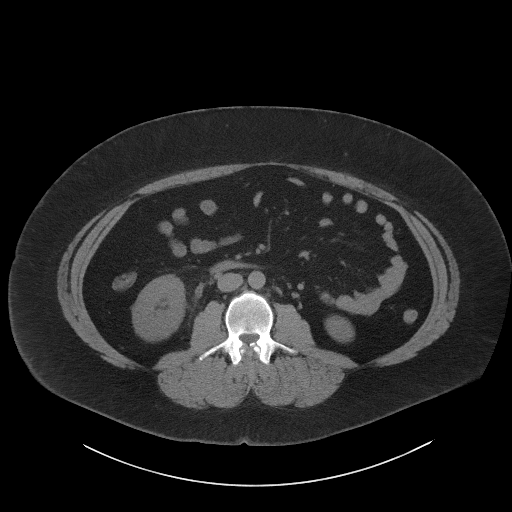
[im 80/125  soft-tissue]
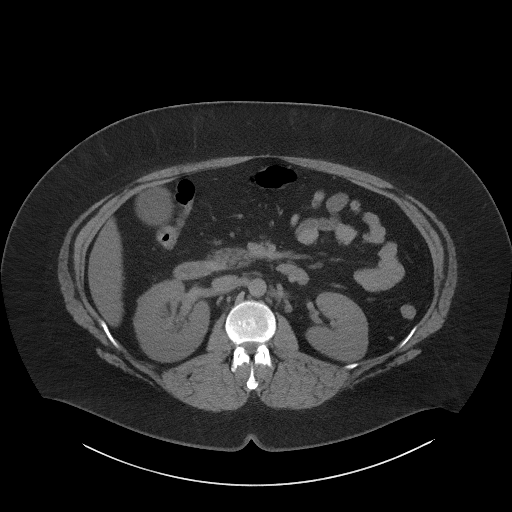
[im 80/125  bone]
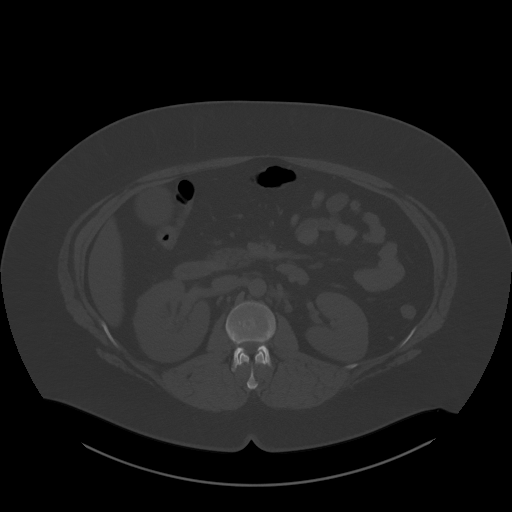
[im 90/125  soft-tissue]
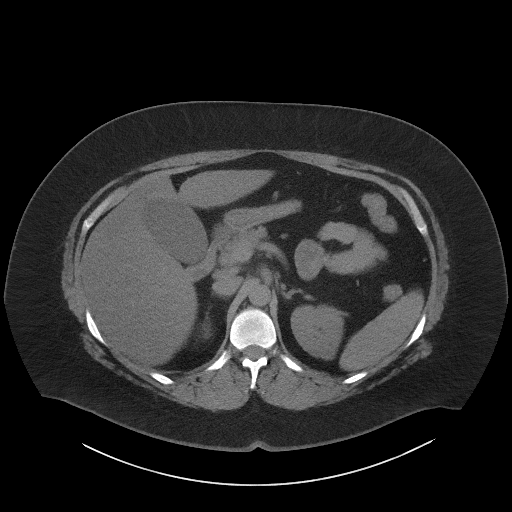
[im 100/125  soft-tissue]
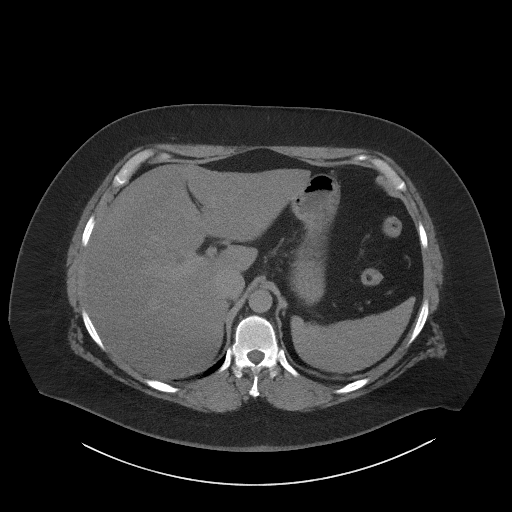
[im 110/125  soft-tissue]
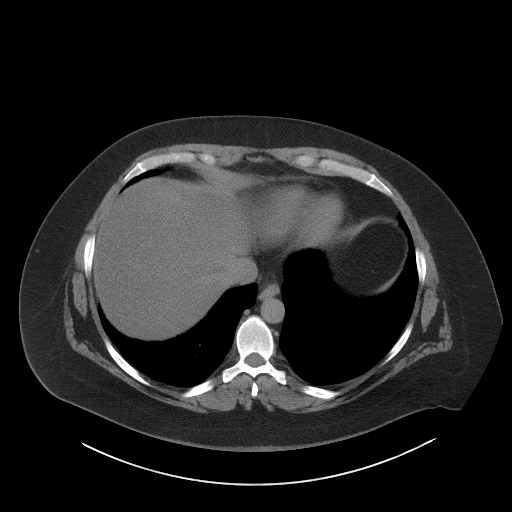
[im 120/125  soft-tissue]
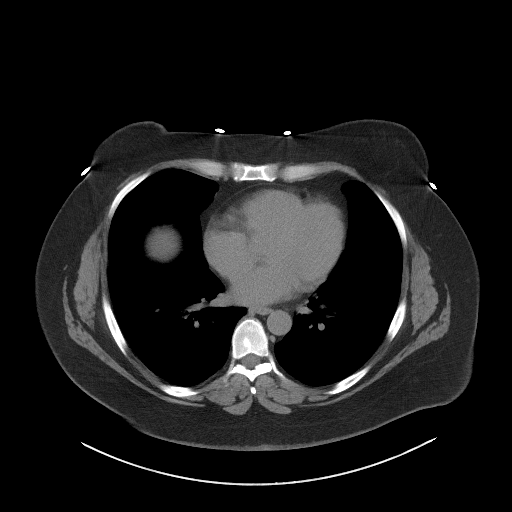

[Series 5: coronal st · coronal · 0.98mm/px · 3 of 115 slices shown]
[im 39/115  soft-tissue]
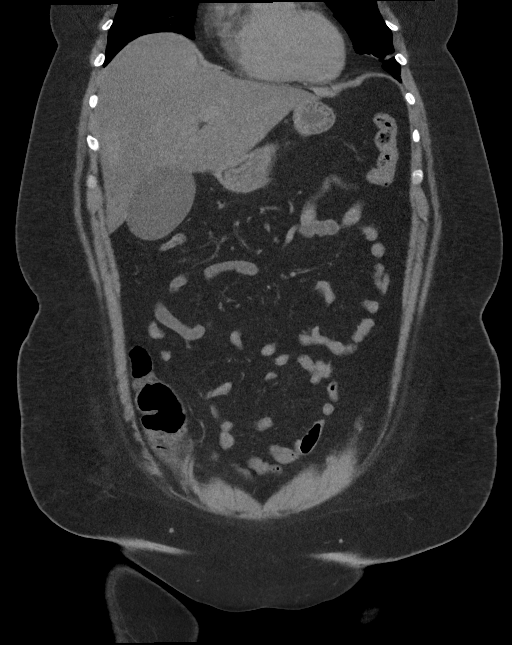
[im 51/115  soft-tissue]
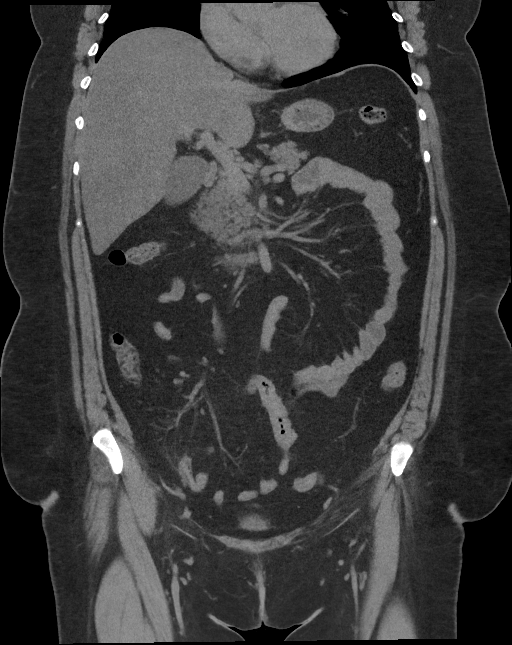
[im 64/115  soft-tissue]
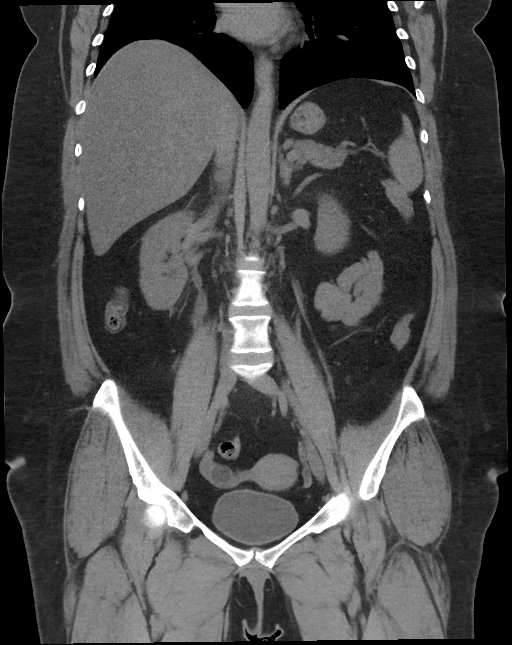

[16 of 46 positions shown; findings below may reference images not displayed]

FINDINGS: Lower chest: No acute abnormality.

Hepatobiliary: Diffuse hepatic steatosis noted. No focal liver
abnormality identified. The gallbladder appears normal. No bile duct
dilatation.

Pancreas: Unremarkable. No pancreatic ductal dilatation or
surrounding inflammatory changes.

Spleen: Normal in size without focal abnormality.

Adrenals/Urinary Tract: Normal adrenal glands.

Punctate stone noted within inferior pole of the left kidney
measuring 2-3 mm, image 51/2. No left kidney mass or hydronephrosis.
Interpolar right kidney stone measures 3 mm, image 51/2. Asymmetric
right-sided perinephric fat stranding, edema and mild hydronephrosis
is identified. Right hydroureter with periureteral fat stranding is
identified. No ureteral calculi identified at this time. However,
within the urinary bladder there is a 4 mm stone within the bladder
base, image 105/2.

Stomach/Bowel: Stomach is within normal limits. Appendix appears
normal. No evidence of bowel wall thickening, distention, or
inflammatory changes.

Vascular/Lymphatic: No significant vascular findings are present. No
enlarged abdominal or pelvic lymph nodes.

Reproductive: Uterus and bilateral adnexa are unremarkable.

Other: No free fluid or fluid collection.

Musculoskeletal: No acute or significant osseous findings.
IMPRESSION: 1. Right-sided hydronephrosis and hydroureter with perinephric and
periureteral fat stranding. Although no ureteral calculi is
identified at this time, there is a 4 mm stone within the dependent
portion of the urinary bladder.
2. Normal appendix.
3. Hepatic steatosis.

## 2023-03-19 IMAGING — US US ABDOMEN LIMITED
1 series · 15 of 25 positions shown · non-contrast
Comparison: CT scan January 25, 2022

CLINICAL DATA: Abdominal pain

EXAM:
ULTRASOUND ABDOMEN LIMITED RIGHT UPPER QUADRANT

[Series 1: us abdomen limited ruq · 15 of 30 slices shown]
[im 1/30]
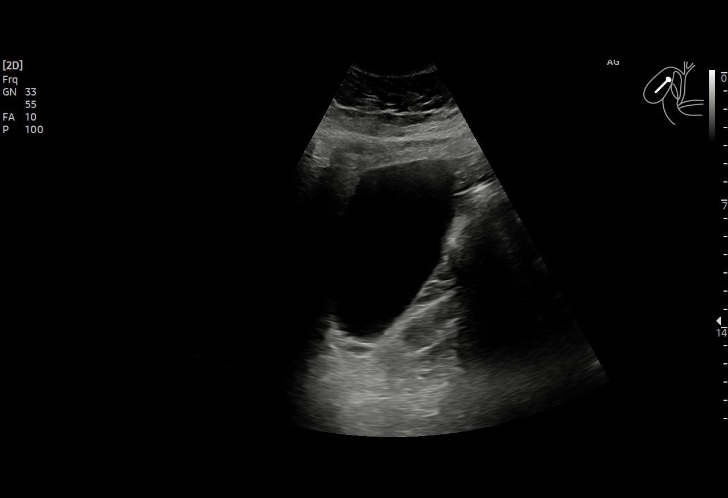
[im 3/30]
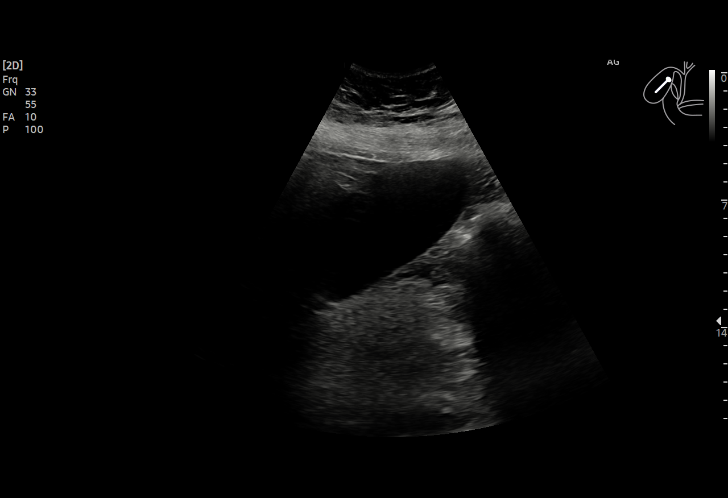
[im 5/30]
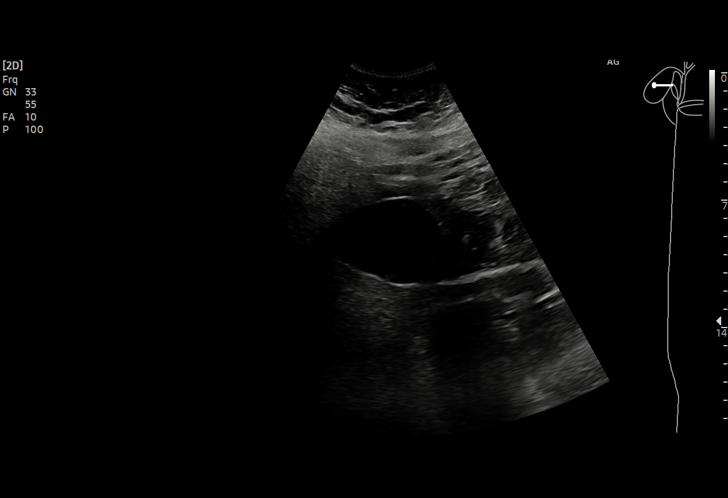
[im 7/30]
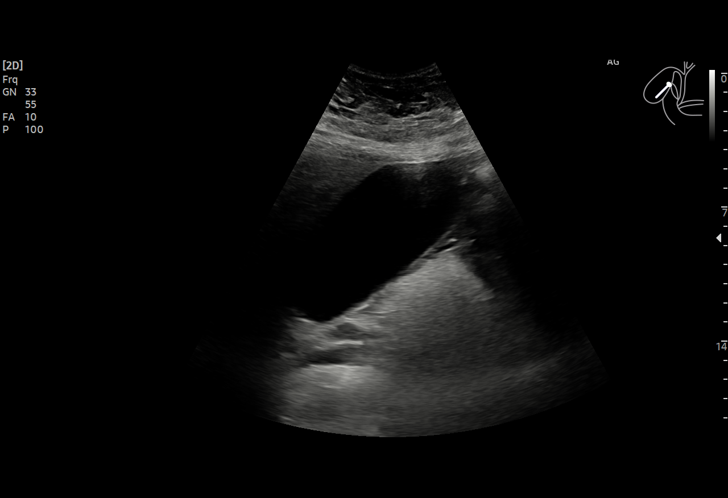
[im 9/30]
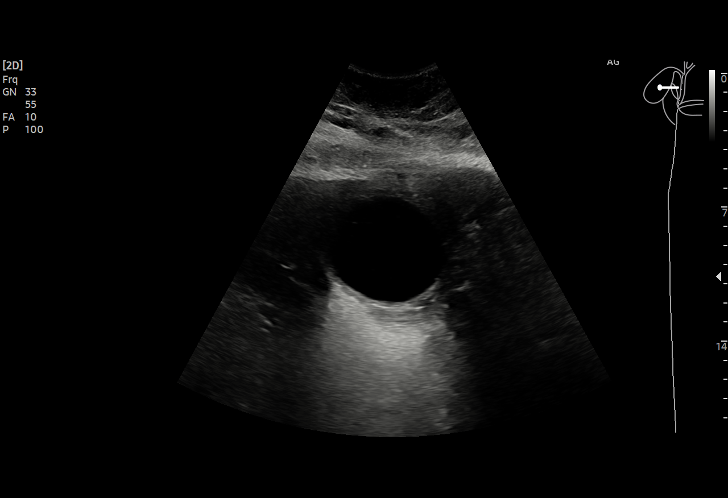
[im 11/30]
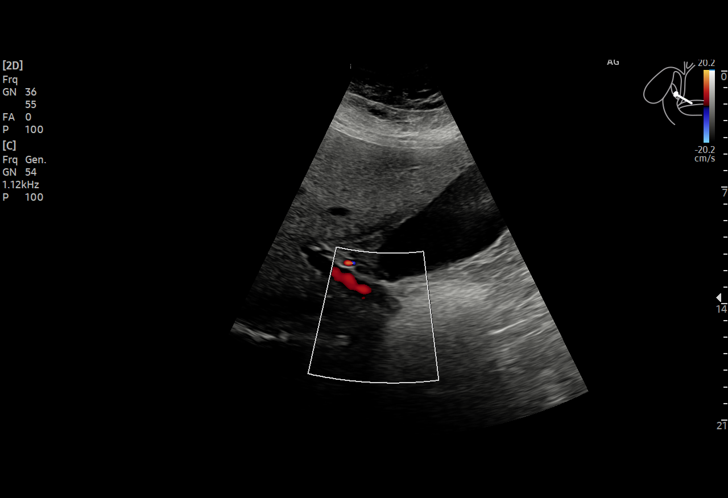
[im 13/30]
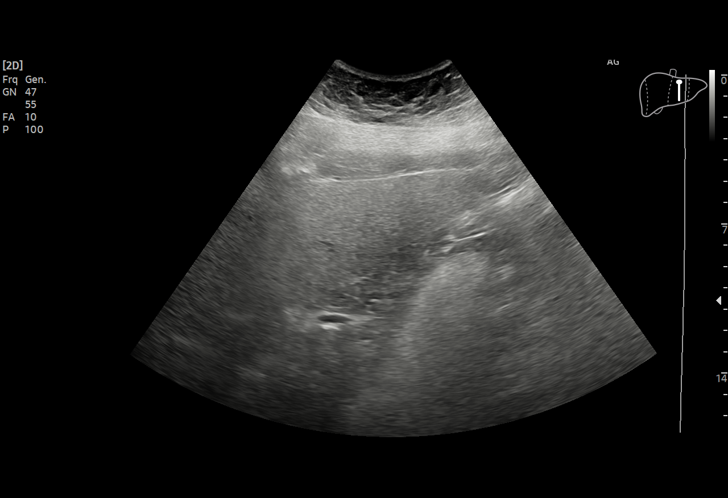
[im 15/30]
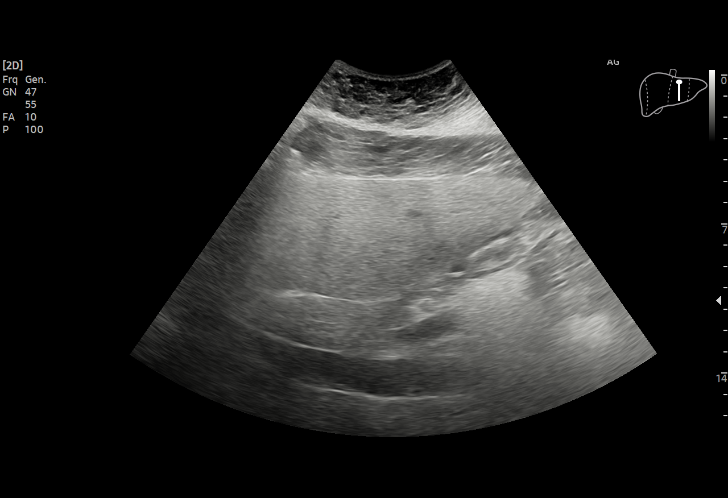
[im 17/30]
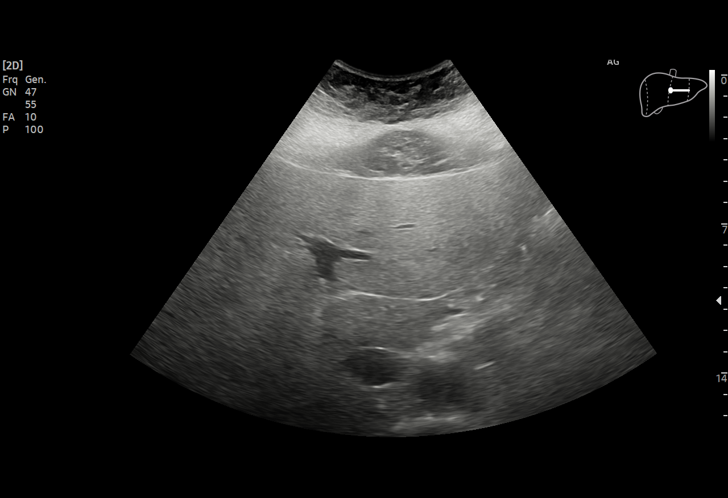
[im 19/30]
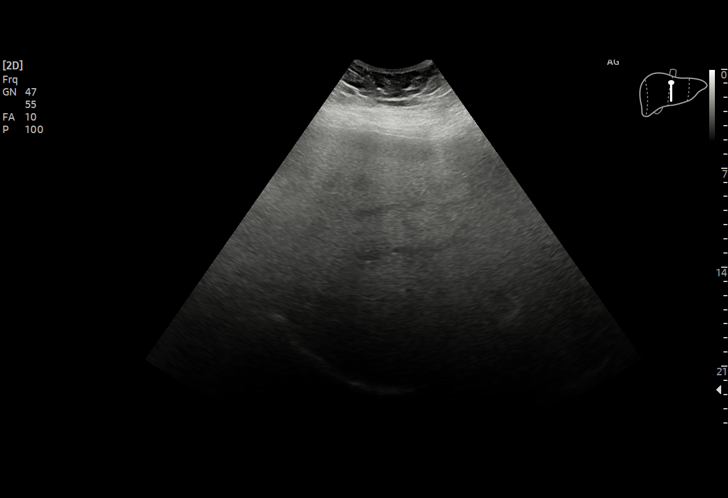
[im 21/30]
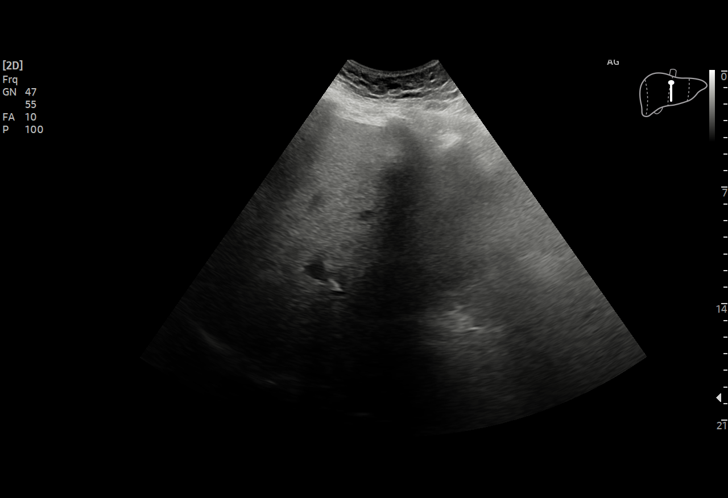
[im 23/30]
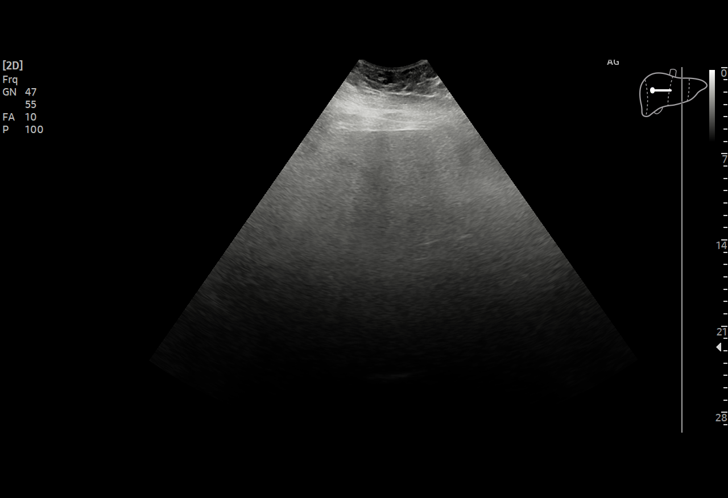
[im 25/30]
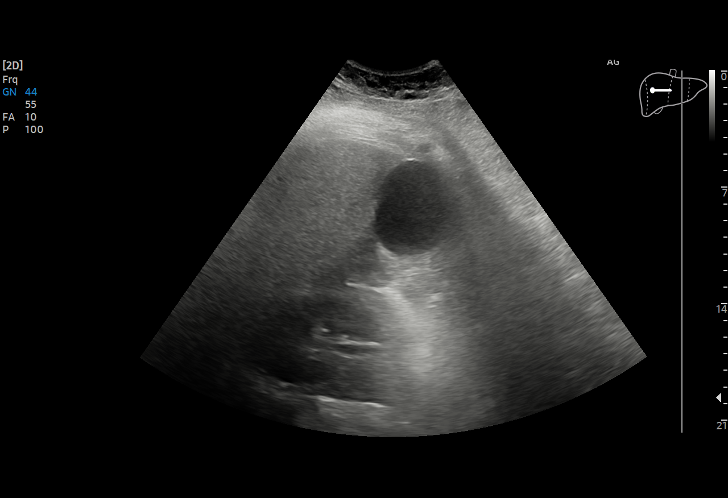
[im 27/30]
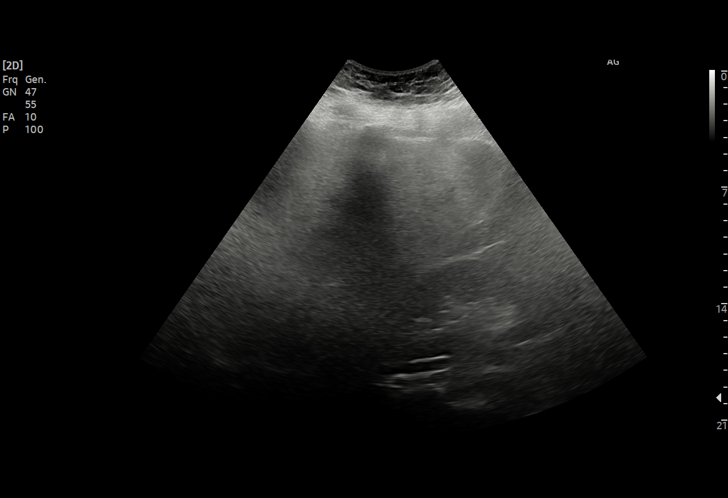
[im 30/30]
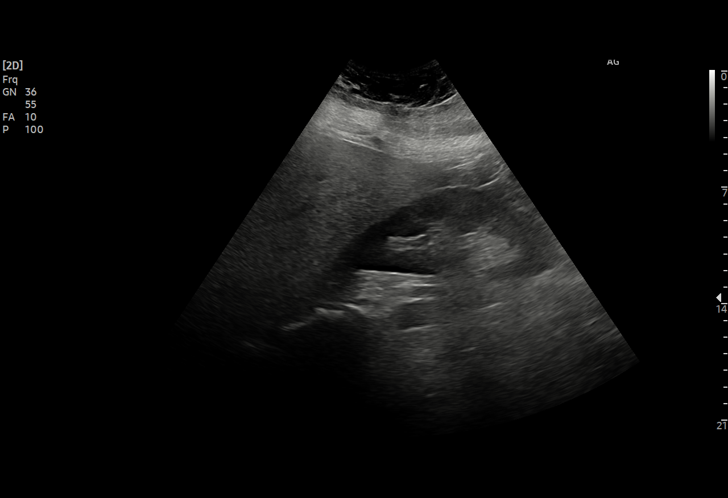

[15 of 25 positions shown; findings below may reference images not displayed]

FINDINGS: Gallbladder:

No gallstones or wall thickening visualized. No sonographic Murphy
sign noted by sonographer.

Common bile duct:

Diameter: 4 mm

Liver:

Diffuse increased echogenicity consistent with known hepatic
steatosis. No mass. Portal vein is patent on color Doppler imaging
with normal direction of blood flow towards the liver.

Other: None.
IMPRESSION: 1. Hepatic steatosis.
2. The gallbladder and common bile duct are normal.

## 2023-04-03 ENCOUNTER — Ambulatory Visit
Admission: RE | Admit: 2023-04-03 | Discharge: 2023-04-03 | Disposition: A | Discharge status: Home / Self Care | Payer: BC Managed Care – PPO | Source: Ambulatory Visit | Attending: Nurse Practitioner | Admitting: Nurse Practitioner

## 2023-04-03 DIAGNOSIS — Z1231 Encounter for screening mammogram for malignant neoplasm of breast: Secondary | ICD-10-CM | POA: Diagnosis present

## 2023-04-05 ENCOUNTER — Encounter: Payer: Self-pay | Admitting: Nurse Practitioner

## 2023-04-05 ENCOUNTER — Other Ambulatory Visit: Payer: Self-pay | Admitting: Nurse Practitioner

## 2023-04-05 DIAGNOSIS — R928 Other abnormal and inconclusive findings on diagnostic imaging of breast: Secondary | ICD-10-CM

## 2023-04-16 ENCOUNTER — Ambulatory Visit
Admission: RE | Admit: 2023-04-16 | Discharge: 2023-04-16 | Disposition: A | Discharge status: Home / Self Care | Payer: BC Managed Care – PPO | Source: Ambulatory Visit | Attending: Nurse Practitioner | Admitting: Nurse Practitioner

## 2023-04-16 DIAGNOSIS — R928 Other abnormal and inconclusive findings on diagnostic imaging of breast: Secondary | ICD-10-CM | POA: Diagnosis present

## 2023-04-22 ENCOUNTER — Other Ambulatory Visit: Payer: Self-pay | Admitting: Family Medicine

## 2023-04-22 DIAGNOSIS — N63 Unspecified lump in unspecified breast: Secondary | ICD-10-CM

## 2023-04-22 DIAGNOSIS — R928 Other abnormal and inconclusive findings on diagnostic imaging of breast: Secondary | ICD-10-CM

## 2023-06-10 ENCOUNTER — Ambulatory Visit
Admission: RE | Admit: 2023-06-10 | Discharge: 2023-06-10 | Disposition: A | Discharge status: Home / Self Care | Payer: BC Managed Care – PPO | Source: Ambulatory Visit | Attending: Family Medicine | Admitting: Family Medicine

## 2023-06-10 DIAGNOSIS — N63 Unspecified lump in unspecified breast: Secondary | ICD-10-CM

## 2023-06-10 DIAGNOSIS — R928 Other abnormal and inconclusive findings on diagnostic imaging of breast: Secondary | ICD-10-CM | POA: Insufficient documentation

## 2023-06-10 HISTORY — PX: BREAST BIOPSY: SHX20

## 2023-06-10 MED ORDER — LIDOCAINE HCL 1 % IJ SOLN
5.0000 mL | Freq: Once | INTRAMUSCULAR | Status: AC
  Administered 2023-06-10: 5 mL
  Filled 2023-06-10: qty 5

## 2023-06-17 ENCOUNTER — Encounter: Payer: Self-pay | Admitting: *Deleted

## 2023-06-17 DIAGNOSIS — D242 Benign neoplasm of left breast: Secondary | ICD-10-CM

## 2023-06-17 NOTE — Progress Notes (Signed)
Referral recieved from [facility] for benign breast mass.  Called and left VM asking for return call to discuss surgeons.   Ms. Mauri Reading would like her referral sent to [facility].   Referral sent, their office will call her with appt. Details.

## 2023-07-05 ENCOUNTER — Encounter: Payer: Self-pay | Admitting: Surgery

## 2023-07-05 ENCOUNTER — Ambulatory Visit: Payer: BC Managed Care – PPO | Admitting: Surgery

## 2023-07-05 VITALS — BP 121/86 | HR 99 | Temp 98.0°F | Ht 72.0 in | Wt 306.0 lb

## 2023-07-05 DIAGNOSIS — D242 Benign neoplasm of left breast: Secondary | ICD-10-CM | POA: Diagnosis not present

## 2023-07-05 NOTE — Patient Instructions (Signed)
We have spoken today about removing a lump in your breast. This will be done by Dr. Aleen Campi at [facility].  You will most likely be able to leave the hospital several hours after your surgery. Rarely, a patient needs to stay over night but this is a possibility.  Plan to tenatively be off work for 1-2 weeks following the surgery and may return with approximately 2 more weeks of a lifting restriction, no greater than 15 lbs.  Please see your Blue surgery sheet for more information. Our surgery scheduler will call you to look at surgery dates and to go over information.   What is radio frequency localization of the breast?(RFID) RFID tag localization uses radiofrequency technology to accurately pinpoint the tumor. Seeing exactly where the tumor is before surgery helps surgeons more effectively remove the entire tumor and spare surrounding healthy breast tissue.    Lumpectomy A lumpectomy is a form of "breast conserving" or "breast preservation" surgery. It may also be referred to as a partial mastectomy. During a lumpectomy, the portion of the breast that contains the cancerous tumor or breast mass (the lump) is removed. Some normal tissue around the lump may also be removed to make sure all of the tumor has been removed.  LET [facility] CARE PROVIDER KNOW ABOUT: Any allergies you have. All medicines you are taking, including vitamins, herbs, eye drops, creams, and over-the-counter medicines. Previous problems you or members of your family have had with the use of anesthetics. Any blood disorders you have. Previous surgeries you have had. Medical conditions you have. RISKS AND COMPLICATIONS Generally, this is a safe procedure. However, problems can occur and include: Bleeding. Infection. Pain. Temporary swelling. Change in the shape of the breast, particularly if a large portion is removed. BEFORE THE PROCEDURE Ask your health care provider about changing or stopping your regular medicines.  This is especially important if you are taking diabetes medicines or blood thinners. Do not eat or drink anything after midnight on the night before the procedure or as directed by your health care provider. Ask your health care provider if you can take a sip of water with any approved medicines. On the day of surgery, your health care provider will use a mammogram or ultrasound to locate and mark the tumor in your breast. These markings on your breast will show where the cut (incision) will be made. PROCEDURE  An IV tube will be put into one of your veins. You may be given medicine to help you relax before the surgery (sedative). You will be given one of the following: A medicine that numbs the area (local anesthetic). A medicine that makes you fall asleep (general anesthetic). Your health care provider will use a kind of electric scalpel that uses heat to minimize bleeding (electrocautery knife). A curved incision (like a smile or frown) that follows the natural curve of your breast is made, to allow for minimal scarring and better healing. The tumor will be removed with some of the surrounding tissue. This will be sent to the lab for analysis. Your health care provider may also remove your lymph nodes at this time if needed. Sometimes, but not always, a rubber tube called a drain will be surgically inserted into your breast area or armpit to collect excess fluid that may accumulate in the space where the tumor was. This drain is connected to a plastic bulb on the outside of your body. This drain creates suction to help remove the fluid. The incisions will  be closed with stitches (sutures). A bandage may be placed over the incisions. AFTER THE PROCEDURE You will be taken to the recovery area. You will be given medicine for pain. A small rubber drain may be placed in the breast for 2-3 days to prevent a collection of blood (hematoma) from developing in the breast. You will be given instructions on  caring for the drain before you go home. A pressure bandage (dressing) will be applied for 1-2 days to prevent bleeding. Ask your health care provider how to care for your bandage at home.   This information is not intended to replace advice given to you by your health care provider. Make sure you discuss any questions you have with your health care provider.   Document Released: 01/21/2007 Document Revised: 12/31/2014 Document Reviewed: 05/15/2013 Elsevier Interactive Patient Education [employer]

## 2023-07-05 NOTE — Progress Notes (Signed)
07/05/2023  Reason for Visit:  left breast intraductal papilloma with atypia  Requesting Provider:  Orlean Patten, NP  History of Present Illness: Evelyn Estrada is a 42 y.o. 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  The patient had her first screening mammogram just recently on 04/03/23.  She had not had any prior mammograms.  She denies having any symptoms and denies any breast pain, drainage from nipple, skin changes, or other concerns.  Her mammogram found possible distortions/asymmetry in the retroareolar ducts of the left breast.  Ultrasound showed possibly 4 different masses in the retroareolar space, 2 of which were larger in size.  These two were biopsied on 06/10/23.  One biopsy resulted as benign breast ductal epithelium, but was found to be discordant due to the images.  The other one showed atypical intraductal papillary neoplasm, which was also ER positive.  It was recommended that the two areas are excised.  Past Medical History: Past Medical History:  Diagnosis Date   Chronic joint pain    Depression    Family history of breast cancer    8/23 cancer genetic testing letter sent   History of kidney stones    several times   Hypertension      Past Surgical History: Past Surgical History:  Procedure Laterality Date   BREAST BIOPSY Left 06/10/2023   retro 3:00 coil, path pending   BREAST BIOPSY Left 06/10/2023   retro 4:00 heart clip path pending   BREAST BIOPSY Left 06/10/2023   [place] LT BREAST BX W LOC DEV 1ST LESION IMG BX SPEC US GUIDE 06/10/2023 [facility]   BREAST BIOPSY Left 06/10/2023   [place] LT BREAST BX W LOC DEV EA ADD LESION IMG BX SPEC US GUIDE 06/10/2023 [facility]   COLONOSCOPY WITH PROPOFOL N/A 05/30/2020   Procedure: COLONOSCOPY WITH PROPOFOL;  Surgeon: Toney Reil, MD;  Location: [facility];  Service: Gastroenterology;  Laterality: N/A;   EXTRACORPOREAL SHOCK WAVE LITHOTRIPSY  Right 11/29/2016   Procedure: EXTRACORPOREAL SHOCK WAVE LITHOTRIPSY (ESWL);  Surgeon: Orson Ape, MD;  Location: [facility];  Service: Urology;  Laterality: Right;   TUBAL LIGATION  2002    Home Medications: Prior to Admission medications   Medication Sig Start Date End Date Taking? Authorizing Provider  buPROPion (WELLBUTRIN XL) 150 MG 24 hr tablet Take 150 mg by mouth every morning. 05/24/23  Yes [provider]  escitalopram (LEXAPRO) 10 MG tablet Take 15 mg by mouth daily.   Yes [provider]  hydrochlorothiazide (HYDRODIURIL) 25 MG tablet Take 25 mg by mouth daily. 12/21/19  Yes [provider]  hydrOXYzine (ATARAX/VISTARIL) 10 MG tablet Take 10 mg by mouth 3 (three) times daily. 11/17/19  Yes [provider]  losartan (COZAAR) 50 MG tablet Take 50 mg by mouth daily. 12/21/19  Yes [provider]  OZEMPIC, 2 MG/DOSE, 8 MG/3ML SOPN Inject into the skin. 06/14/23  Yes [provider]  potassium chloride SA (KLOR-CON M) 20 MEQ tablet Take 20 mEq by mouth daily. 05/24/23  Yes [provider]    Allergies: Allergies  Allergen Reactions   Phenergan [Promethazine Hcl] Rash    Social History:  reports that she quit smoking about 22 years ago. Her smoking use included cigarettes. She has been exposed to tobacco smoke. She has never used smokeless tobacco. She reports that she does not drink alcohol and does not use drugs.   Family History: Family History  Problem Relation Age  of Onset   Arthritis/Rheumatoid Mother    Prostate cancer Father    Breast cancer Paternal Grandfather 1    Review of Systems: Review of Systems  Constitutional:  Negative for chills and fever.  HENT:  Negative for hearing loss.   Respiratory:  Negative for shortness of breath.   Cardiovascular:  Negative for chest pain.  Gastrointestinal:  Negative for abdominal pain, nausea and vomiting.  Genitourinary:  Negative for dysuria.   Musculoskeletal:  Negative for myalgias.  Skin:  Negative for rash.  Neurological:  Negative for dizziness.  Psychiatric/Behavioral:  Negative for depression.     Physical Exam BP 121/86   Pulse 99   Temp 98 F (36.7 C)   Ht 6' (1.829 m)   Wt (!) 306 lb (138.8 kg)   LMP 06/10/2023 (Exact Date)   SpO2 96%   BMI 41.50 kg/m  CONSTITUTIONAL: No acute distress HEENT:  Normocephalic, atraumatic, extraocular motion intact. NECK: Trachea is midline, and there is no jugular venous distension.  RESPIRATORY:  Lungs are clear, and breath sounds are equal bilaterally. Normal respiratory effort without pathologic use of accessory muscles. CARDIOVASCULAR: Heart is regular without murmurs, gallops, or rubs. BREAST:  Left breast without palpable masses, skin changes, or nipple changes.  There may be some palpable firmness at 4 o'clock position in the areola, which could be inflammation change from her biopsy.  No left axillary lymphadenopathy.  Right breast without any palpable masses, skin changes, or nipple changes.  No right axillary lymphadenopathy. MUSCULOSKELETAL:  Normal muscle strength and tone in all four extremities.  No peripheral edema or cyanosis. SKIN: Skin turgor is normal. There are no pathologic skin lesions.  NEUROLOGIC:  Motor and sensation is grossly normal.  Cranial nerves are grossly intact. PSYCH:  Alert and oriented to person, place and time. Affect is normal.  Laboratory Analysis: Left breast biopsy on 06/10/23:   Imaging: Left breast Ultrasound on 04/16/23: FINDINGS: Targeted ultrasound is performed, showing duct ectasia in the RETROAREOLAR LEFT breast.   There are at least 4 intraductal masses in the RETROAREOLAR LEFTbreast, measuring 0.8 cm, 0.6 cm, 0.6 cm and 0.4 cm.   No abnormal LEFT axillary lymph nodes are noted.   IMPRESSION: At least 4 intraductal masses in the RETROAREOLAR LEFT breast, indeterminate but most likely representing papillomas.  Tissue sampling of at least the 2 largest masses recommended, or at the radiologist discretion. No abnormal LEFT axillary lymph nodes.   RECOMMENDATION: Ultrasound-guided LEFT breast biopsies, which will be scheduled.   I have discussed the findings and recommendations with the patient. If applicable, a reminder letter will be sent to the patient regarding the next appointment.   BI-RADS CATEGORY  4: Suspicious.  Assessment and Plan: This is a 42 y.o. 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  Overall two sites were biopsied and although one of them was benign finding, it does not match the imaging findings and thus is discordant.  The other site shows an atypical intraductal papillary neoplasm, which was ER positive on further testing.  Discussed with her the need to excise both areas, particularly the area with atypia.  She's in agreement. --Discussed with her then the plan for a left breast tag localized lumpectomy of the two biopsy sites.  These may require separate tags vs one if the distance between the two is short enough.  Discussed with her the surgery at length with  the patient including the planned incision, the risks of bleeding, infection, injury to surrounding structures, that this would be an outpatient surgery, pain control, activity restrictions, and she's willing to proceed. --Will schedule the patient for surgery on 07/30/23.  All of her questions have been answered.  I spent 55 minutes dedicated to the care of this patient on the date of this encounter to include pre-visit review of records, face-to-face time with the patient discussing diagnosis and management, and any post-visit coordination of care.   Howie Ill, MD  [facility]

## 2023-07-05 NOTE — H&P (View-Only) (Signed)
07/05/2023  Reason for Visit:  left breast intraductal papilloma with atypia  Requesting Provider:  Orlean Patten, NP  History of Present Illness: Evelyn Estrada is a 42 y.o. 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  The patient had her first screening mammogram just recently on 04/03/23.  She had not had any prior mammograms.  She denies having any symptoms and denies any breast pain, drainage from nipple, skin changes, or other concerns.  Her mammogram found possible distortions/asymmetry in the retroareolar ducts of the left breast.  Ultrasound showed possibly 4 different masses in the retroareolar space, 2 of which were larger in size.  These two were biopsied on 06/10/23.  One biopsy resulted as benign breast ductal epithelium, but was found to be discordant due to the images.  The other one showed atypical intraductal papillary neoplasm, which was also ER positive.  It was recommended that the two areas are excised.  Past Medical History: Past Medical History:  Diagnosis Date   Chronic joint pain    Depression    Family history of breast cancer    8/23 cancer genetic testing letter sent   History of kidney stones    several times   Hypertension      Past Surgical History: Past Surgical History:  Procedure Laterality Date   BREAST BIOPSY Left 06/10/2023   retro 3:00 coil, path pending   BREAST BIOPSY Left 06/10/2023   retro 4:00 heart clip path pending   BREAST BIOPSY Left 06/10/2023   [place] LT BREAST BX W LOC DEV 1ST LESION IMG BX SPEC US GUIDE 06/10/2023 [facility]   BREAST BIOPSY Left 06/10/2023   [place] LT BREAST BX W LOC DEV EA ADD LESION IMG BX SPEC US GUIDE 06/10/2023 [facility]   COLONOSCOPY WITH PROPOFOL N/A 05/30/2020   Procedure: COLONOSCOPY WITH PROPOFOL;  Surgeon: Toney Reil, MD;  Location: [facility];  Service: Gastroenterology;  Laterality: N/A;   EXTRACORPOREAL SHOCK WAVE LITHOTRIPSY  Right 11/29/2016   Procedure: EXTRACORPOREAL SHOCK WAVE LITHOTRIPSY (ESWL);  Surgeon: Orson Ape, MD;  Location: [facility];  Service: Urology;  Laterality: Right;   TUBAL LIGATION  2002    Home Medications: Prior to Admission medications   Medication Sig Start Date End Date Taking? Authorizing Provider  buPROPion (WELLBUTRIN XL) 150 MG 24 hr tablet Take 150 mg by mouth every morning. 05/24/23  Yes [provider]  escitalopram (LEXAPRO) 10 MG tablet Take 15 mg by mouth daily.   Yes [provider]  hydrochlorothiazide (HYDRODIURIL) 25 MG tablet Take 25 mg by mouth daily. 12/21/19  Yes [provider]  hydrOXYzine (ATARAX/VISTARIL) 10 MG tablet Take 10 mg by mouth 3 (three) times daily. 11/17/19  Yes [provider]  losartan (COZAAR) 50 MG tablet Take 50 mg by mouth daily. 12/21/19  Yes [provider]  OZEMPIC, 2 MG/DOSE, 8 MG/3ML SOPN Inject into the skin. 06/14/23  Yes [provider]  potassium chloride SA (KLOR-CON M) 20 MEQ tablet Take 20 mEq by mouth daily. 05/24/23  Yes [provider]    Allergies: Allergies  Allergen Reactions   Phenergan [Promethazine Hcl] Rash    Social History:  reports that she quit smoking about 22 years ago. Her smoking use included cigarettes. She has been exposed to tobacco smoke. She has never used smokeless tobacco. She reports that she does not drink alcohol and does not use drugs.   Family History: Family History  Problem Relation Age  of Onset   Arthritis/Rheumatoid Mother    Prostate cancer Father    Breast cancer Paternal Grandfather 1    Review of Systems: Review of Systems  Constitutional:  Negative for chills and fever.  HENT:  Negative for hearing loss.   Respiratory:  Negative for shortness of breath.   Cardiovascular:  Negative for chest pain.  Gastrointestinal:  Negative for abdominal pain, nausea and vomiting.  Genitourinary:  Negative for dysuria.   Musculoskeletal:  Negative for myalgias.  Skin:  Negative for rash.  Neurological:  Negative for dizziness.  Psychiatric/Behavioral:  Negative for depression.     Physical Exam BP 121/86   Pulse 99   Temp 98 F (36.7 C)   Ht 6' (1.829 m)   Wt (!) 306 lb (138.8 kg)   LMP 06/10/2023 (Exact Date)   SpO2 96%   BMI 41.50 kg/m  CONSTITUTIONAL: No acute distress HEENT:  Normocephalic, atraumatic, extraocular motion intact. NECK: Trachea is midline, and there is no jugular venous distension.  RESPIRATORY:  Lungs are clear, and breath sounds are equal bilaterally. Normal respiratory effort without pathologic use of accessory muscles. CARDIOVASCULAR: Heart is regular without murmurs, gallops, or rubs. BREAST:  Left breast without palpable masses, skin changes, or nipple changes.  There may be some palpable firmness at 4 o'clock position in the areola, which could be inflammation change from her biopsy.  No left axillary lymphadenopathy.  Right breast without any palpable masses, skin changes, or nipple changes.  No right axillary lymphadenopathy. MUSCULOSKELETAL:  Normal muscle strength and tone in all four extremities.  No peripheral edema or cyanosis. SKIN: Skin turgor is normal. There are no pathologic skin lesions.  NEUROLOGIC:  Motor and sensation is grossly normal.  Cranial nerves are grossly intact. PSYCH:  Alert and oriented to person, place and time. Affect is normal.  Laboratory Analysis: Left breast biopsy on 06/10/23:   Imaging: Left breast Ultrasound on 04/16/23: FINDINGS: Targeted ultrasound is performed, showing duct ectasia in the RETROAREOLAR LEFT breast.   There are at least 4 intraductal masses in the RETROAREOLAR LEFTbreast, measuring 0.8 cm, 0.6 cm, 0.6 cm and 0.4 cm.   No abnormal LEFT axillary lymph nodes are noted.   IMPRESSION: At least 4 intraductal masses in the RETROAREOLAR LEFT breast, indeterminate but most likely representing papillomas.  Tissue sampling of at least the 2 largest masses recommended, or at the radiologist discretion. No abnormal LEFT axillary lymph nodes.   RECOMMENDATION: Ultrasound-guided LEFT breast biopsies, which will be scheduled.   I have discussed the findings and recommendations with the patient. If applicable, a reminder letter will be sent to the patient regarding the next appointment.   BI-RADS CATEGORY  4: Suspicious.  Assessment and Plan: This is a 42 y.o. 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  Overall two sites were biopsied and although one of them was benign finding, it does not match the imaging findings and thus is discordant.  The other site shows an atypical intraductal papillary neoplasm, which was ER positive on further testing.  Discussed with her the need to excise both areas, particularly the area with atypia.  She's in agreement. --Discussed with her then the plan for a left breast tag localized lumpectomy of the two biopsy sites.  These may require separate tags vs one if the distance between the two is short enough.  Discussed with her the surgery at length with  the patient including the planned incision, the risks of bleeding, infection, injury to surrounding structures, that this would be an outpatient surgery, pain control, activity restrictions, and she's willing to proceed. --Will schedule the patient for surgery on 07/30/23.  All of her questions have been answered.  I spent 55 minutes dedicated to the care of this patient on the date of this encounter to include pre-visit review of records, face-to-face time with the patient discussing diagnosis and management, and any post-visit coordination of care.   Howie Ill, MD  [facility]

## 2023-07-08 ENCOUNTER — Other Ambulatory Visit: Payer: Self-pay | Admitting: Surgery

## 2023-07-08 DIAGNOSIS — R928 Other abnormal and inconclusive findings on diagnostic imaging of breast: Secondary | ICD-10-CM

## 2023-07-09 ENCOUNTER — Telehealth: Payer: Self-pay | Admitting: Surgery

## 2023-07-09 NOTE — Telephone Encounter (Signed)
Left message for patient to call, please inform her of the following regarding scheduled surgery with Dr. Aleen Campi.   Pre-Admission date/time, and Surgery date at [facility].  Surgery Date: 07/30/23 Preadmission Testing Date: 07/22/23 (phone 8a-1p)  Also patient will need to call at [phone], between 1-3:00pm the day before surgery, to find out what time to arrive for surgery.    Also to remind patient of her RF tag to be placed at [facility] 07/10/23.

## 2023-07-09 NOTE — Telephone Encounter (Signed)
Patient calls back, she is now informed of all dates of surgery.

## 2023-07-10 ENCOUNTER — Other Ambulatory Visit: Payer: Self-pay | Admitting: Surgery

## 2023-07-10 ENCOUNTER — Ambulatory Visit
Admission: RE | Admit: 2023-07-10 | Discharge: 2023-07-10 | Disposition: A | Discharge status: Home / Self Care | Payer: BC Managed Care – PPO | Source: Ambulatory Visit | Attending: Surgery | Admitting: Surgery

## 2023-07-10 ENCOUNTER — Other Ambulatory Visit: Payer: BC Managed Care – PPO

## 2023-07-10 ENCOUNTER — Inpatient Hospital Stay: Admission: RE | Admit: 2023-07-10 | Payer: BC Managed Care – PPO | Source: Ambulatory Visit

## 2023-07-10 DIAGNOSIS — R928 Other abnormal and inconclusive findings on diagnostic imaging of breast: Secondary | ICD-10-CM | POA: Diagnosis present

## 2023-07-10 HISTORY — PX: BREAST BIOPSY: SHX20

## 2023-07-10 MED ORDER — LIDOCAINE HCL 1 % IJ SOLN
5.0000 mL | Freq: Once | INTRAMUSCULAR | Status: AC
  Administered 2023-07-10: 5 mL
  Filled 2023-07-10: qty 5

## 2023-07-10 MED ORDER — LIDOCAINE HCL 1 % IJ SOLN
10.0000 mL | Freq: Once | INTRAMUSCULAR | Status: AC
  Administered 2023-07-10: 10 mL
  Filled 2023-07-10: qty 10

## 2023-07-18 ENCOUNTER — Other Ambulatory Visit: Payer: BC Managed Care – PPO

## 2023-07-22 ENCOUNTER — Inpatient Hospital Stay
Admission: RE | Admit: 2023-07-22 | Discharge: 2023-07-22 | Disposition: A | Discharge status: Home / Self Care | Payer: BC Managed Care – PPO | Source: Ambulatory Visit

## 2023-07-22 HISTORY — DX: Acute respiratory failure with hypoxia: J96.01

## 2023-07-22 HISTORY — DX: Influenza due to unidentified influenza virus with other respiratory manifestations: J11.1

## 2023-07-22 HISTORY — DX: Hemorrhage of anus and rectum: K62.5

## 2023-07-22 HISTORY — DX: Generalized anxiety disorder: F41.1

## 2023-07-22 HISTORY — DX: Irritable bowel syndrome, unspecified: K58.9

## 2023-07-22 HISTORY — DX: Respiratory failure, unspecified with hypoxia: J96.91

## 2023-07-22 NOTE — Patient Instructions (Addendum)
Your procedure is scheduled on: Tuesday August 6  Report to the Registration Desk on the 1st floor of the [facility] To find out your arrival time, please call [phone] between 1PM - 3PM on: Monday August 5  If your arrival time is 6:00 am, do not arrive before that time as the [facility] entrance doors do not open until 6:00 am.  REMEMBER: Instructions that are not followed completely may result in serious medical risk, up to and including death; or upon the discretion of your surgeon and anesthesiologist your surgery may need to be rescheduled.  Do not eat food after midnight the night before surgery.  No gum chewing or hard candies.  You may however, drink CLEAR liquids up to 2 hours before you are scheduled to arrive for your surgery. Do not drink anything within 2 hours of your scheduled arrival time.  Clear liquids include: - water  - apple juice without pulp - gatorade (not RED colors) - black coffee or tea (Do NOT add milk or creamers to the coffee or tea) Do NOT drink anything that is not on this list.   In addition, your doctor has ordered for you to drink the provided:  Ensure Pre-Surgery Clear Carbohydrate Drink   Drinking this carbohydrate drink up to two hours before surgery helps to reduce insulin resistance and improve patient outcomes. Please complete drinking 2 hours before scheduled arrival time.  One week prior to surgery: Stop Anti-inflammatories (NSAIDS) such as Advil, Aleve, Ibuprofen, Motrin, Naproxen, Naprosyn and Aspirin based products such as Excedrin, Goody's Powder, BC Powder. Stop ANY OVER THE COUNTER supplements until after surgery. potassium chloride SA (KLOR-CON M)   You may however, continue to take Tylenol if needed for pain up until the day of surgery.  Continue taking all prescribed medications with the exception of the following: hydrochlorothiazide (HYDRODIURIL)  hold the day of surgery , last dose Tuesday August 6  hydrOXYzine  (ATARAX/VISTARIL)  hold the day of surgery, last dose Tuesday August 6  losartan (COZAAR) hold the day of surgery, lst dose Tuesday August 6  OZEMPIC stop 7 days prior to surgery, last dose Monday July 29    Follow recommendations from Cardiologist or PCP regarding stopping blood thinners.  TAKE ONLY THESE MEDICATIONS THE MORNING OF SURGERY WITH A SIP OF WATER:  buPROPion (WELLBUTRIN XL  escitalopram (LEXAPRO)    No Alcohol for 24 hours before or after surgery.  No Smoking including e-cigarettes for 24 hours before surgery.  No chewable tobacco products for at least 6 hours before surgery.  No nicotine patches on the day of surgery.  Do not use any "recreational" drugs for at least a week (preferably 2 weeks) before your surgery.  Please be advised that the combination of cocaine and anesthesia may have negative outcomes, up to and including death. If you test positive for cocaine, your surgery will be cancelled.  On the morning of surgery brush your teeth with toothpaste and water, you may rinse your mouth with mouthwash if you wish. Do not swallow any toothpaste or mouthwash.  Use CHG Soap as directed on instruction sheet.  Do not wear jewelry, make-up, hairpins, clips or nail polish.  Do not wear lotions, powders, or perfumes.   Do not shave body hair from the neck down 48 hours before surgery.  Contact lenses, hearing aids and dentures may not be worn into surgery.  Do not bring valuables to the hospital. [facility] is not responsible for any missing/lost  belongings or valuables.    Notify your doctor if there is any change in your medical condition (cold, fever, infection).  Wear comfortable clothing (specific to your surgery type) to the hospital.  After surgery, you can help prevent lung complications by doing breathing exercises.  Take deep breaths and cough every 1-2 hours. IIf you are being discharged the day of surgery, you will not be allowed to drive  home. You will need a responsible individual to drive you home and stay with you for 24 hours after surgery.   If you are taking public transportation, you will need to have a responsible individual with you.  Please call the Pre-admissions Testing Dept. at [phone] if you have any questions about these instructions.  Surgery Visitation Policy:  Patients having surgery or a procedure may have two visitors.  Children under the age of 35 must have an adult with them who is not the patient.      Preparing for Surgery with CHLORHEXIDINE GLUCONATE (CHG) Soap  Chlorhexidine Gluconate (CHG) Soap  o An antiseptic cleaner that kills germs and bonds with the skin to continue killing germs even after washing  o Used for showering the night before surgery and morning of surgery  Before surgery, you can play an important role by reducing the number of germs on your skin.  CHG (Chlorhexidine gluconate) soap is an antiseptic cleanser which kills germs and bonds with the skin to continue killing germs even after washing.  Please do not use if you have an allergy to CHG or antibacterial soaps. If your skin becomes reddened/irritated stop using the CHG.  1. Shower the NIGHT BEFORE SURGERY and the MORNING OF SURGERY with CHG soap.  2. If you choose to wash your hair, wash your hair first as usual with your normal shampoo.  3. After shampooing, rinse your hair and body thoroughly to remove the shampoo.  4. Use CHG as you would any other liquid soap. You can apply CHG directly to the skin and wash gently with a scrungie or a clean washcloth.  5. Apply the CHG soap to your body only from the neck down. Do not use on open wounds or open sores. Avoid contact with your eyes, ears, mouth, and genitals (private parts). Wash face and genitals (private parts) with your normal soap.  6. Wash thoroughly, paying special attention to the area where your surgery will be performed.  7. Thoroughly rinse  your body with warm water.  8. Do not shower/wash with your normal soap after using and rinsing off the CHG soap.  9. Pat yourself dry with a clean towel.  10. Wear clean pajamas to bed the night before surgery.  12. Place clean sheets on your bed the night of your first shower and do not sleep with pets.  13. Shower again with the CHG soap on the day of surgery prior to arriving at the hospital.  14. Do not apply any deodorants/lotions/powders.  15. Please wear clean clothes to the hospital.

## 2023-07-24 ENCOUNTER — Encounter
Admission: RE | Admit: 2023-07-24 | Discharge: 2023-07-24 | Disposition: A | Discharge status: Home / Self Care | Payer: BC Managed Care – PPO | Source: Ambulatory Visit | Attending: Surgery | Admitting: Surgery

## 2023-07-24 ENCOUNTER — Inpatient Hospital Stay: Admission: RE | Admit: 2023-07-24 | Payer: BC Managed Care – PPO | Source: Ambulatory Visit

## 2023-07-24 ENCOUNTER — Other Ambulatory Visit: Payer: Self-pay

## 2023-07-24 VITALS — Ht 72.0 in | Wt 320.0 lb

## 2023-07-24 DIAGNOSIS — Z01810 Encounter for preprocedural cardiovascular examination: Secondary | ICD-10-CM

## 2023-07-24 DIAGNOSIS — Z01812 Encounter for preprocedural laboratory examination: Secondary | ICD-10-CM

## 2023-07-24 DIAGNOSIS — Z01818 Encounter for other preprocedural examination: Secondary | ICD-10-CM

## 2023-07-24 DIAGNOSIS — R7303 Prediabetes: Secondary | ICD-10-CM | POA: Diagnosis not present

## 2023-07-24 DIAGNOSIS — I10 Essential (primary) hypertension: Secondary | ICD-10-CM

## 2023-07-24 HISTORY — DX: Prediabetes: R73.03

## 2023-07-24 LAB — BASIC METABOLIC PANEL
Anion gap: 6 (ref 5–15)
BUN: 11 mg/dL (ref 6–20)
CO2: 22 mmol/L (ref 22–32)
Calcium: 8.6 mg/dL — ABNORMAL LOW (ref 8.9–10.3)
Chloride: 107 mmol/L (ref 98–111)
Creatinine, Ser: 0.75 mg/dL (ref 0.44–1.00)
GFR, Estimated: >60 mL/min (ref >60)
Glucose, Bld: 127 mg/dL — ABNORMAL HIGH (ref 70–99)
Potassium: 3.4 mmol/L — ABNORMAL LOW (ref 3.5–5.1)
Sodium: 135 mmol/L (ref 135–145)

## 2023-07-24 LAB — CBC
HCT: 40.7 % (ref 36.0–46.0)
Hemoglobin: 14 g/dL (ref 12.0–15.0)
MCH: 30.2 pg (ref 26.0–34.0)
MCHC: 34.4 g/dL (ref 30.0–36.0)
MCV: 87.7 fL (ref 80.0–100.0)
Platelets: 405 10*3/uL — ABNORMAL HIGH (ref 150–400)
RBC: 4.64 MIL/uL (ref 3.87–5.11)
RDW: 13.2 % (ref 11.5–15.5)
WBC: 12 10*3/uL — ABNORMAL HIGH (ref 4.0–10.5)
nRBC: 0 % (ref 0.0–0.2)

## 2023-07-24 NOTE — Patient Instructions (Addendum)
Your procedure is scheduled on: Tuesday , August 6  Report to the Registration Desk on the 1st floor of the [facility] To find out your arrival time, please call [phone] between 1PM - 3PM on: Monday August 5 If your arrival time is 6:00 am, do not arrive before that time as the Medical Mall entrance doors do not open until 6:00 am.  REMEMBER: Instructions that are not followed completely may result in serious medical risk, up to and including death; or upon the discretion of your surgeon and anesthesiologist your surgery may need to be rescheduled.  Do not eat food after midnight the night before surgery.  No gum chewing or hard candies.  You may however, drink CLEAR liquids up to 2 hours before you are scheduled to arrive for your surgery. Do not drink anything within 2 hours of your scheduled arrival time.  Clear liquids include: - water  - apple juice without pulp - gatorade (not RED colors) - black coffee or tea (Do NOT add milk or creamers to the coffee or tea) Do NOT drink anything that is not on this list.  One week prior to surgery: Stop Anti-inflammatories (NSAIDS) such as Advil, Aleve, Ibuprofen, Motrin, Naproxen, Naprosyn and Aspirin based products such as Excedrin, Goody's Powder, BC Powder. Stop ANY OVER THE COUNTER supplements until after surgery. cetirizine (ZYRTEC)   You may however, continue to take Tylenol if needed for pain up until the day of surgery.  Continue taking all prescribed medications with the exception of the following: OZEMPIC hold dose 7 days prior to surgery date , last dose Monday July 29 losartan (COZAAR) hold the day of surgery, last dose Monday August 5  hydrochlorothiazide (HYDRODIURIL) hold the day of surgery, last dose Monday August 5    TAKE ONLY THESE MEDICATIONS THE MORNING OF SURGERY WITH A SIP OF WATER:  buPROPion (WELLBUTRIN XL  hydrOXYzine (ATARAX/VISTARIL)   No Alcohol for 24 hours before or after surgery.  No Smoking  including e-cigarettes for 24 hours before surgery.  No chewable tobacco products for at least 6 hours before surgery.  No nicotine patches on the day of surgery.  Do not use any "recreational" drugs for at least a week (preferably 2 weeks) before your surgery.  Please be advised that the combination of cocaine and anesthesia may have negative outcomes, up to and including death. If you test positive for cocaine, your surgery will be cancelled.  On the morning of surgery brush your teeth with toothpaste and water, you may rinse your mouth with mouthwash if you wish. Do not swallow any toothpaste or mouthwash.  Use CHG Soap or wipes as directed on instruction sheet.  Do not wear jewelry, make-up, hairpins, clips or nail polish.  Do not wear lotions, powders, or perfumes.   Do not shave body hair from the neck down 48 hours before surgery.  Contact lenses, hearing aids and dentures may not be worn into surgery.  Do not bring valuables to the hospital. [facility] is not responsible for any missing/lost belongings or valuables.   Notify your doctor if there is any change in your medical condition (cold, fever, infection).  Wear comfortable clothing (specific to your surgery type) to the hospital.  After surgery, you can help prevent lung complications by doing breathing exercises.  Take deep breaths and cough every 1-2 hours.   If you are being discharged the day of surgery, you will not be allowed to drive home. You will need a  responsible individual to drive you home and stay with you for 24 hours after surgery.   If you are taking public transportation, you will need to have a responsible individual with you.  Please call the Pre-admissions Testing Dept. at [phone] if you have any questions about these instructions.  Surgery Visitation Policy:  Patients having surgery or a procedure may have two visitors.  Children under the age of 6 must have an adult with them who is  not the patient.      Preparing for Surgery with CHLORHEXIDINE GLUCONATE (CHG) Soap  Chlorhexidine Gluconate (CHG) Soap  o An antiseptic cleaner that kills germs and bonds with the skin to continue killing germs even after washing  o Used for showering the night before surgery and morning of surgery  Before surgery, you can play an important role by reducing the number of germs on your skin.  CHG (Chlorhexidine gluconate) soap is an antiseptic cleanser which kills germs and bonds with the skin to continue killing germs even after washing.  Please do not use if you have an allergy to CHG or antibacterial soaps. If your skin becomes reddened/irritated stop using the CHG.  1. Shower the NIGHT BEFORE SURGERY and the MORNING OF SURGERY with CHG soap.  2. If you choose to wash your hair, wash your hair first as usual with your normal shampoo.  3. After shampooing, rinse your hair and body thoroughly to remove the shampoo.  4. Use CHG as you would any other liquid soap. You can apply CHG directly to the skin and wash gently with a scrungie or a clean washcloth.  5. Apply the CHG soap to your body only from the neck down. Do not use on open wounds or open sores. Avoid contact with your eyes, ears, mouth, and genitals (private parts). Wash face and genitals (private parts) with your normal soap.  6. Wash thoroughly, paying special attention to the area where your surgery will be performed.  7. Thoroughly rinse your body with warm water.  8. Do not shower/wash with your normal soap after using and rinsing off the CHG soap.  9. Pat yourself dry with a clean towel.  10. Wear clean pajamas to bed the night before surgery.  12. Place clean sheets on your bed the night of your first shower and do not sleep with pets.  13. Shower again with the CHG soap on the day of surgery prior to arriving at the hospital.  14. Do not apply any deodorants/lotions/powders.  15. Please wear clean  clothes to the hospital.

## 2023-07-30 ENCOUNTER — Encounter
Admission: RE | Disposition: A | Discharge status: Home / Self Care | Payer: Self-pay | Source: Ambulatory Visit | Attending: Surgery

## 2023-07-30 ENCOUNTER — Encounter: Payer: Self-pay | Admitting: Surgery

## 2023-07-30 ENCOUNTER — Other Ambulatory Visit: Payer: Self-pay

## 2023-07-30 ENCOUNTER — Ambulatory Visit: Payer: BC Managed Care – PPO | Admitting: Urgent Care

## 2023-07-30 ENCOUNTER — Ambulatory Visit
Admission: RE | Admit: 2023-07-30 | Discharge: 2023-07-30 | Disposition: A | Discharge status: Home / Self Care | Payer: BC Managed Care – PPO | Source: Ambulatory Visit | Attending: Surgery | Admitting: Surgery

## 2023-07-30 ENCOUNTER — Ambulatory Visit: Payer: BC Managed Care – PPO | Admitting: Anesthesiology

## 2023-07-30 ENCOUNTER — Other Ambulatory Visit: Payer: Self-pay | Admitting: Surgery

## 2023-07-30 DIAGNOSIS — I10 Essential (primary) hypertension: Secondary | ICD-10-CM | POA: Insufficient documentation

## 2023-07-30 DIAGNOSIS — F419 Anxiety disorder, unspecified: Secondary | ICD-10-CM | POA: Diagnosis not present

## 2023-07-30 DIAGNOSIS — N6082 Other benign mammary dysplasias of left breast: Secondary | ICD-10-CM | POA: Insufficient documentation

## 2023-07-30 DIAGNOSIS — Z6841 Body Mass Index (BMI) 40.0 and over, adult: Secondary | ICD-10-CM | POA: Insufficient documentation

## 2023-07-30 DIAGNOSIS — N6022 Fibroadenosis of left breast: Secondary | ICD-10-CM | POA: Insufficient documentation

## 2023-07-30 DIAGNOSIS — Z87891 Personal history of nicotine dependence: Secondary | ICD-10-CM | POA: Insufficient documentation

## 2023-07-30 DIAGNOSIS — F32A Depression, unspecified: Secondary | ICD-10-CM | POA: Diagnosis not present

## 2023-07-30 DIAGNOSIS — Z01818 Encounter for other preprocedural examination: Secondary | ICD-10-CM

## 2023-07-30 DIAGNOSIS — Z01812 Encounter for preprocedural laboratory examination: Secondary | ICD-10-CM

## 2023-07-30 DIAGNOSIS — D242 Benign neoplasm of left breast: Secondary | ICD-10-CM | POA: Diagnosis present

## 2023-07-30 DIAGNOSIS — E119 Type 2 diabetes mellitus without complications: Secondary | ICD-10-CM | POA: Diagnosis not present

## 2023-07-30 DIAGNOSIS — R7303 Prediabetes: Secondary | ICD-10-CM

## 2023-07-30 DIAGNOSIS — Z01810 Encounter for preprocedural cardiovascular examination: Secondary | ICD-10-CM

## 2023-07-30 DIAGNOSIS — R928 Other abnormal and inconclusive findings on diagnostic imaging of breast: Secondary | ICD-10-CM

## 2023-07-30 HISTORY — PX: BREAST LUMPECTOMY WITH RADIOFREQUENCY TAG IDENTIFICATION: SHX6884

## 2023-07-30 LAB — POCT PREGNANCY, URINE: Preg Test, Ur: NEGATIVE

## 2023-07-30 LAB — GLUCOSE, CAPILLARY
Glucose-Capillary: 88 mg/dL (ref 70–99)
Glucose-Capillary: 125 mg/dL — ABNORMAL HIGH (ref 70–99)

## 2023-07-30 SURGERY — BREAST LUMPECTOMY WITH RADIOFREQUENCY TAG IDENTIFICATION
Anesthesia: General | Laterality: Left

## 2023-07-30 MED ORDER — BUPIVACAINE LIPOSOME 1.3 % IJ SUSP
INTRAMUSCULAR | Status: DC | PRN
  Administered 2023-07-30: 10 mL

## 2023-07-30 MED ORDER — PROPOFOL 1000 MG/100ML IV EMUL
INTRAVENOUS | Status: AC
  Filled 2023-07-30: qty 100

## 2023-07-30 MED ORDER — PROPOFOL 10 MG/ML IV BOLUS
INTRAVENOUS | Status: AC
  Filled 2023-07-30: qty 20

## 2023-07-30 MED ORDER — CHLORHEXIDINE GLUCONATE 0.12 % MT SOLN
OROMUCOSAL | Status: AC
  Filled 2023-07-30: qty 15

## 2023-07-30 MED ORDER — ACETAMINOPHEN 500 MG PO TABS: 1000.0000 mg | ORAL_TABLET | Freq: Four times a day (QID) | ORAL | Status: AC | PRN

## 2023-07-30 MED ORDER — LIDOCAINE HCL (PF) 2 % IJ SOLN
INTRAMUSCULAR | Status: AC
  Filled 2023-07-30: qty 5

## 2023-07-30 MED ORDER — FENTANYL CITRATE (PF) 100 MCG/2ML IJ SOLN: 25.0000 ug | INTRAMUSCULAR | Status: DC | PRN

## 2023-07-30 MED ORDER — DEXAMETHASONE SODIUM PHOSPHATE 10 MG/ML IJ SOLN
INTRAMUSCULAR | Status: DC | PRN
  Administered 2023-07-30: 10 mg via INTRAVENOUS

## 2023-07-30 MED ORDER — ORAL CARE MOUTH RINSE: 15.0000 mL | Freq: Once | OROMUCOSAL | Status: AC

## 2023-07-30 MED ORDER — DROPERIDOL 2.5 MG/ML IJ SOLN: 0.6250 mg | Freq: Once | INTRAMUSCULAR | Status: DC | PRN

## 2023-07-30 MED ORDER — OXYCODONE HCL 5 MG PO TABS: 5.0000 mg | ORAL_TABLET | ORAL | 0 refills | Status: DC | PRN

## 2023-07-30 MED ORDER — BUPIVACAINE HCL (PF) 0.5 % IJ SOLN
INTRAMUSCULAR | Status: AC
  Filled 2023-07-30: qty 30

## 2023-07-30 MED ORDER — CHLORHEXIDINE GLUCONATE CLOTH 2 % EX PADS: 6.0000 | MEDICATED_PAD | Freq: Once | CUTANEOUS | Status: DC

## 2023-07-30 MED ORDER — KETOROLAC TROMETHAMINE 30 MG/ML IJ SOLN
INTRAMUSCULAR | Status: DC | PRN
  Administered 2023-07-30: 30 mg via INTRAVENOUS

## 2023-07-30 MED ORDER — STERILE WATER FOR IRRIGATION IR SOLN
Status: DC | PRN
  Administered 2023-07-30: 200 mL

## 2023-07-30 MED ORDER — ROCURONIUM BROMIDE 10 MG/ML (PF) SYRINGE
PREFILLED_SYRINGE | INTRAVENOUS | Status: AC
  Filled 2023-07-30: qty 10

## 2023-07-30 MED ORDER — CEFAZOLIN IN SODIUM CHLORIDE 3-0.9 GM/100ML-% IV SOLN
3.0000 g | INTRAVENOUS | Status: AC
  Administered 2023-07-30: 3 g via INTRAVENOUS
  Filled 2023-07-30 (×2): qty 100

## 2023-07-30 MED ORDER — KETAMINE HCL 10 MG/ML IJ SOLN
INTRAMUSCULAR | Status: DC | PRN
  Administered 2023-07-30: 50 mg via INTRAVENOUS

## 2023-07-30 MED ORDER — PHENYLEPHRINE HCL (PRESSORS) 10 MG/ML IV SOLN
INTRAVENOUS | Status: DC | PRN
  Administered 2023-07-30: 80 ug via INTRAVENOUS

## 2023-07-30 MED ORDER — ACETAMINOPHEN 500 MG PO TABS
ORAL_TABLET | ORAL | Status: AC
  Filled 2023-07-30: qty 2

## 2023-07-30 MED ORDER — LACTATED RINGERS IV SOLN: INTRAVENOUS | Status: DC

## 2023-07-30 MED ORDER — FAMOTIDINE 20 MG PO TABS
20.0000 mg | ORAL_TABLET | Freq: Once | ORAL | Status: AC
  Administered 2023-07-30: 20 mg via ORAL

## 2023-07-30 MED ORDER — CHLORHEXIDINE GLUCONATE 0.12 % MT SOLN
15.0000 mL | Freq: Once | OROMUCOSAL | Status: AC
  Administered 2023-07-30: 15 mL via OROMUCOSAL

## 2023-07-30 MED ORDER — BUPIVACAINE LIPOSOME 1.3 % IJ SUSP
INTRAMUSCULAR | Status: AC
  Filled 2023-07-30: qty 10

## 2023-07-30 MED ORDER — ROCURONIUM BROMIDE 100 MG/10ML IV SOLN
INTRAVENOUS | Status: DC | PRN
  Administered 2023-07-30: 10 mg via INTRAVENOUS
  Administered 2023-07-30: 40 mg via INTRAVENOUS

## 2023-07-30 MED ORDER — KETAMINE HCL 50 MG/5ML IJ SOSY
PREFILLED_SYRINGE | INTRAMUSCULAR | Status: AC
  Filled 2023-07-30: qty 5

## 2023-07-30 MED ORDER — FENTANYL CITRATE (PF) 100 MCG/2ML IJ SOLN
INTRAMUSCULAR | Status: DC | PRN
  Administered 2023-07-30: 50 ug via INTRAVENOUS
  Administered 2023-07-30 (×2): 25 ug via INTRAVENOUS

## 2023-07-30 MED ORDER — BUPIVACAINE LIPOSOME 1.3 % IJ SUSP: 10.0000 mL | Freq: Once | INTRAMUSCULAR | Status: DC

## 2023-07-30 MED ORDER — PROPOFOL 10 MG/ML IV BOLUS
INTRAVENOUS | Status: DC | PRN
  Administered 2023-07-30: 150 ug/kg/min via INTRAVENOUS
  Administered 2023-07-30: 20 mg via INTRAVENOUS
  Administered 2023-07-30: 200 mg via INTRAVENOUS
  Administered 2023-07-30: 50 mg via INTRAVENOUS

## 2023-07-30 MED ORDER — LIDOCAINE HCL (CARDIAC) PF 100 MG/5ML IV SOSY
PREFILLED_SYRINGE | INTRAVENOUS | Status: DC | PRN
  Administered 2023-07-30: 100 mg via INTRAVENOUS

## 2023-07-30 MED ORDER — SUCCINYLCHOLINE CHLORIDE 200 MG/10ML IV SOSY
PREFILLED_SYRINGE | INTRAVENOUS | Status: DC | PRN
  Administered 2023-07-30: 160 mg via INTRAVENOUS

## 2023-07-30 MED ORDER — CHLORHEXIDINE GLUCONATE CLOTH 2 % EX PADS
6.0000 | MEDICATED_PAD | Freq: Once | CUTANEOUS | Status: AC
  Administered 2023-07-30: 6 via TOPICAL

## 2023-07-30 MED ORDER — OXYCODONE HCL 5 MG/5ML PO SOLN: 5.0000 mg | Freq: Once | ORAL | Status: DC | PRN

## 2023-07-30 MED ORDER — HYDROMORPHONE HCL 1 MG/ML IJ SOLN
INTRAMUSCULAR | Status: DC | PRN
Start: 2023-07-30 — End: 2023-07-30
  Administered 2023-07-30 (×2): .25 mg via INTRAVENOUS
  Administered 2023-07-30: .5 mg via INTRAVENOUS

## 2023-07-30 MED ORDER — ONDANSETRON HCL 4 MG/2ML IJ SOLN
INTRAMUSCULAR | Status: DC | PRN
Start: 2023-07-30 — End: 2023-07-30
  Administered 2023-07-30: 4 mg via INTRAVENOUS

## 2023-07-30 MED ORDER — MIDAZOLAM HCL 2 MG/2ML IJ SOLN
INTRAMUSCULAR | Status: AC
  Filled 2023-07-30: qty 2

## 2023-07-30 MED ORDER — ONDANSETRON HCL 4 MG/2ML IJ SOLN
INTRAMUSCULAR | Status: AC
  Filled 2023-07-30: qty 2

## 2023-07-30 MED ORDER — BUPIVACAINE-EPINEPHRINE 0.5% -1:200000 IJ SOLN
INTRAMUSCULAR | Status: DC | PRN
  Administered 2023-07-30: 30 mL

## 2023-07-30 MED ORDER — OXYCODONE HCL 5 MG PO TABS: 5.0000 mg | ORAL_TABLET | Freq: Once | ORAL | Status: DC | PRN

## 2023-07-30 MED ORDER — SUGAMMADEX SODIUM 200 MG/2ML IV SOLN
INTRAVENOUS | Status: DC | PRN
  Administered 2023-07-30: 290.4 mg via INTRAVENOUS

## 2023-07-30 MED ORDER — DEXAMETHASONE SODIUM PHOSPHATE 10 MG/ML IJ SOLN
INTRAMUSCULAR | Status: AC
  Filled 2023-07-30: qty 1

## 2023-07-30 MED ORDER — GABAPENTIN 300 MG PO CAPS
ORAL_CAPSULE | ORAL | Status: AC
  Filled 2023-07-30: qty 1

## 2023-07-30 MED ORDER — EPINEPHRINE PF 1 MG/ML IJ SOLN
INTRAMUSCULAR | Status: AC
  Filled 2023-07-30: qty 1

## 2023-07-30 MED ORDER — ACETAMINOPHEN 500 MG PO TABS
1000.0000 mg | ORAL_TABLET | ORAL | Status: AC
  Administered 2023-07-30: 1000 mg via ORAL

## 2023-07-30 MED ORDER — GABAPENTIN 300 MG PO CAPS
300.0000 mg | ORAL_CAPSULE | ORAL | Status: AC
  Administered 2023-07-30: 300 mg via ORAL

## 2023-07-30 MED ORDER — IBUPROFEN 600 MG PO TABS: 600.0000 mg | ORAL_TABLET | Freq: Three times a day (TID) | ORAL | 1 refills | Status: AC | PRN

## 2023-07-30 MED ORDER — PHENYLEPHRINE 80 MCG/ML (10ML) SYRINGE FOR IV PUSH (FOR BLOOD PRESSURE SUPPORT)
PREFILLED_SYRINGE | INTRAVENOUS | Status: AC
  Filled 2023-07-30: qty 10

## 2023-07-30 MED ORDER — ACETAMINOPHEN 10 MG/ML IV SOLN: 1000.0000 mg | Freq: Once | INTRAVENOUS | Status: DC | PRN

## 2023-07-30 MED ORDER — FENTANYL CITRATE (PF) 100 MCG/2ML IJ SOLN
INTRAMUSCULAR | Status: AC
  Filled 2023-07-30: qty 2

## 2023-07-30 MED ORDER — MIDAZOLAM HCL 2 MG/2ML IJ SOLN
INTRAMUSCULAR | Status: DC | PRN
  Administered 2023-07-30: 2 mg via INTRAVENOUS

## 2023-07-30 MED ORDER — HYDROMORPHONE HCL 1 MG/ML IJ SOLN
INTRAMUSCULAR | Status: AC
  Filled 2023-07-30: qty 1

## 2023-07-30 MED ORDER — FAMOTIDINE 20 MG PO TABS
ORAL_TABLET | ORAL | Status: AC
  Filled 2023-07-30: qty 1

## 2023-07-30 MED ORDER — KETOROLAC TROMETHAMINE 30 MG/ML IJ SOLN
INTRAMUSCULAR | Status: AC
  Filled 2023-07-30: qty 1

## 2023-07-30 SURGICAL SUPPLY — 50 items
ADH SKN CLS APL DERMABOND .7 (GAUZE/BANDAGES/DRESSINGS) ×1
APL PRP STRL LF DISP 70% ISPRP (MISCELLANEOUS) ×1
APPLIER CLIP 9.375 SM OPEN (CLIP)
APR CLP SM 9.3 20 MLT OPN (CLIP)
BINDER BREAST LRG (GAUZE/BANDAGES/DRESSINGS) IMPLANT
BINDER BREAST MEDIUM (GAUZE/BANDAGES/DRESSINGS) IMPLANT
BLADE PHOTON ILLUMINATED (MISCELLANEOUS) ×1 IMPLANT
BLADE SURG 15 STRL LF DISP TIS (BLADE) ×2 IMPLANT
BLADE SURG 15 STRL SS (BLADE) ×2
CHLORAPREP W/TINT 26 (MISCELLANEOUS) ×1 IMPLANT
CLIP APPLIE 9.375 SM OPEN (CLIP) IMPLANT
CNTNR URN SCR LID CUP LEK RST (MISCELLANEOUS) IMPLANT
CONT SPEC 4OZ STRL OR WHT (MISCELLANEOUS)
DERMABOND ADVANCED .7 DNX12 (GAUZE/BANDAGES/DRESSINGS) ×1 IMPLANT
DEVICE DUBIN SPECIMEN MAMMOGRA (MISCELLANEOUS) ×1 IMPLANT
DRAPE LAPAROTOMY 100X77 ABD (DRAPES) ×1 IMPLANT
DRSG GAUZE FLUFF 36X18 (GAUZE/BANDAGES/DRESSINGS) ×1 IMPLANT
ELECT REM PT RETURN 9FT ADLT (ELECTROSURGICAL) ×1
ELECTRODE REM PT RTRN 9FT ADLT (ELECTROSURGICAL) ×1 IMPLANT
GAUZE 4X4 16PLY ~~LOC~~+RFID DBL (SPONGE) ×1 IMPLANT
GLOVE SURG SYN 7.0 (GLOVE) ×7 IMPLANT
GLOVE SURG SYN 7.0 PF PI (GLOVE) ×1 IMPLANT
GLOVE SURG SYN 7.5 E (GLOVE) ×7 IMPLANT
GLOVE SURG SYN 7.5 PF PI (GLOVE) ×1 IMPLANT
GOWN STRL REUS W/ TWL LRG LVL3 (GOWN DISPOSABLE) ×2 IMPLANT
GOWN STRL REUS W/TWL LRG LVL3 (GOWN DISPOSABLE) ×3
KIT MARKER MARGIN INK (KITS) IMPLANT
KIT TURNOVER KIT A (KITS) ×1 IMPLANT
LABEL OR SOLS (LABEL) ×1 IMPLANT
MANIFOLD NEPTUNE II (INSTRUMENTS) ×1 IMPLANT
NDL HYPO 22X1.5 SAFETY MO (MISCELLANEOUS) ×1 IMPLANT
NEEDLE HYPO 22X1.5 SAFETY MO (MISCELLANEOUS) ×1 IMPLANT
PACK BASIN MINOR ARMC (MISCELLANEOUS) ×1 IMPLANT
SHEATH BREAST BIOPSY SKIN MKR (SHEATH) ×1 IMPLANT
SUT ETHILON 3-0 FS-10 30 BLK (SUTURE)
SUT MNCRL 4-0 (SUTURE) ×1
SUT MNCRL 4-0 27XMFL (SUTURE) ×1
SUT SILK 3 0 SH 30 (SUTURE) IMPLANT
SUT VIC AB 2-0 SH 27 (SUTURE) ×2
SUT VIC AB 2-0 SH 27XBRD (SUTURE) IMPLANT
SUT VIC AB 3-0 SH 27 (SUTURE) ×2
SUT VIC AB 3-0 SH 27X BRD (SUTURE) ×1 IMPLANT
SUTURE EHLN 3-0 FS-10 30 BLK (SUTURE) IMPLANT
SUTURE MNCRL 4-0 27XMF (SUTURE) ×1 IMPLANT
SYR 10ML LL (SYRINGE) ×1 IMPLANT
TAPE TRANSPORE STRL 2 31045 (GAUZE/BANDAGES/DRESSINGS) IMPLANT
TRAP FLUID SMOKE EVACUATOR (MISCELLANEOUS) ×1 IMPLANT
TRAP NEPTUNE SPECIMEN COLLECT (MISCELLANEOUS) ×1 IMPLANT
WATER STERILE IRR 1000ML POUR (IV SOLUTION) ×1 IMPLANT
WATER STERILE IRR 500ML POUR (IV SOLUTION) ×1 IMPLANT

## 2023-07-30 NOTE — Op Note (Signed)
  Procedure Date:  07/30/2023  Pre-operative Diagnosis:   Left breast atypical intraductal papillary neoplasm Left breast benign ductal epithelium, but discordant on imaging.  Post-operative Diagnosis:  1.  Left breast atypical intraductal papillary neoplasm. 2.  Left breast benign ductal epithelium, but discordant on imaging.  Procedure:  Left breast scout tag-localized lumpectomy  Surgeon:  Howie Ill, MD  Assistant:  Lynda Rainwater, PA-S  Anesthesia:  General endotracheal  Estimated Blood Loss:  10 ml  Specimens:   Left breast mass containing both biopsy sites New left breast anterior/medial margin  Complications:  None  Indications for Procedure:  This is a 42 y.o. female who presents with two retroareolar findings, one showing an atypical intraductal papillary neoplasm, and the other showing benign breast ductal epithelium, but discordant on imaging findings.  It has been recommended to excise both areas.  The risks of bleeding, infection, injury to surrounding structures, hematoma, seroma, open wound, cosmetic deformity, and the need for further surgery were all discussed with the patient and was willing to proceed.  Prior to this procedure, the patient had undergone SAVI scout tag localization.  Description of Procedure: The patient was correctly identified in the preoperative area and brought into the operating room.  The patient was placed supine with VTE prophylaxis in place.  Appropriate time-outs were performed.  Anesthesia was induced and the patient was intubated.  Appropriate antibiotics were infused.  The left chest was prepped and draped in usual sterile fashion.  The scout tag localization sites were determined using the SAVI probe.  A periareolar incision was made overlying the tags and clips.  SAVI probe was used to guide our dissection using electrocautery, and a partial mastectomy was performed.  MarginMarker was used to ink each of the sides of the specimen.   The specimen was then imaged to confirm that the areas of concern, biopsy clips, and scout tags were included in the excision.  This was then sent to pathology.  The areas were very close to the anterior/medial margin, so additional margin was sent as a separate specimen and marked as well using MarginMarker.  The cavity was irrigated and hemostasis was assured with electrocautery.  Local anesthetic was infiltrated into the skin and subcutaneous tissue of the cavity.  The wound was then closed in multiple layers with 2-0 vicryl, 3-0 Vicryl and 4-0 Monocryl and sealed with DermaBond.  The patient was emerged from anesthesia and extubated and brought to the recovery room for further management.  The patient tolerated the procedure well and all counts were correct at the end of the case.   Howie Ill, MD

## 2023-07-30 NOTE — Discharge Instructions (Addendum)
AMBULATORY SURGERY  DISCHARGE INSTRUCTIONS   The drugs that you were given will stay in your system until tomorrow so for the next 24 hours you should not:  Drive an automobile Make any legal decisions Drink any alcoholic beverage   You may resume regular meals tomorrow.  Today it is better to start with liquids and gradually work up to solid foods.  You may eat anything you prefer, but it is better to start with liquids, then soup and crackers, and gradually work up to solid foods.   Please notify your doctor immediately if you have any unusual bleeding, trouble breathing, redness and pain at the surgery site, drainage, fever, or pain not relieved by medication.    Additional Instructions:        Please contact your physician with any problems or Same Day Surgery at [phone], Monday through Friday 6 am to 4 pm, or  at [facility] number at [phone].Discharge Instructions: 1.  Patient may shower, but do not scrub wounds heavily and dab dry only. 2.  Do not submerge wounds in pool/tub until fully healed. 3.  Do not apply ointments or hydrogen peroxide to the wounds. 4.  May apply ice packs to the wounds for comfort. 5.  Please wear breast binder at all times for the next two weeks.  May remove for showers.  May apply fluffed gauze over the incision for padding. 6.  Do not drive while taking narcotics for pain control.  Prior to driving, make sure you are able to rotate right and left to look at blindspots without significant pain or discomfort. 7.  Avoid strenuous activity with the left arm for two weeks.

## 2023-07-30 NOTE — Transfer of Care (Signed)
Immediate Anesthesia Transfer of Care Note  Patient: Evelyn Estrada  Procedure(s) Performed: BREAST LUMPECTOMY WITH RADIOFREQUENCY TAG IDENTIFICATION (Left)  Patient Location: PACU  Anesthesia Type:General  Level of Consciousness: awake, alert , and oriented  Airway & Oxygen Therapy: Patient Spontanous Breathing and Patient connected to face mask oxygen  Post-op Assessment: Report given to RN and Post -op Vital signs reviewed and stable  Post vital signs: stable  Last Vitals:  Vitals Value Taken Time  BP 125/69 07/30/23 1149  Temp    Pulse 91 07/30/23 1154  Resp 17 07/30/23 1154  SpO2 98 % 07/30/23 1154  Vitals shown include unfiled device data.  Last Pain:  Vitals:   07/30/23 0846  TempSrc: Temporal  PainSc: 0-No pain         Complications: No notable events documented.

## 2023-07-30 NOTE — Interval H&P Note (Signed)
History and Physical Interval Note:  07/30/2023 8:57 AM  Evelyn Estrada  has presented today for surgery, with the diagnosis of left breast intraductal papilloma.  The various methods of treatment have been discussed with the patient and family. After consideration of risks, benefits and other options for treatment, the patient has consented to  Procedure(s): BREAST LUMPECTOMY WITH RADIOFREQUENCY TAG IDENTIFICATION (Left) as a surgical intervention.  The patient's history has been reviewed, patient examined, no change in status, stable for surgery.  I have reviewed the patient's chart and labs.  Questions were answered to the patient's satisfaction.      

## 2023-07-30 NOTE — Anesthesia Postprocedure Evaluation (Signed)
Anesthesia Post Note  Patient: Evelyn Estrada  Procedure(s) Performed: BREAST LUMPECTOMY WITH RADIOFREQUENCY TAG IDENTIFICATION (Left)  Patient location during evaluation: Endoscopy Anesthesia Type: General Level of consciousness: awake and alert Pain management: pain level controlled Vital Signs Assessment: post-procedure vital signs reviewed and stable Respiratory status: spontaneous breathing, nonlabored ventilation and respiratory function stable Cardiovascular status: blood pressure returned to baseline and stable Postop Assessment: no apparent nausea or vomiting Anesthetic complications: no   No notable events documented.   Last Vitals:  Vitals:   07/30/23 1300 07/30/23 1315  BP: 136/86 134/84  Pulse: 81 80  Resp: 20 18  Temp:  (!) 36.2 C  SpO2: 95% 96%    Last Pain:  Vitals:   07/30/23 1245  TempSrc:   PainSc: Asleep                 Foye Deer

## 2023-07-30 NOTE — Anesthesia Preprocedure Evaluation (Addendum)
Anesthesia Evaluation  Patient identified by MRN, date of birth, ID band Patient awake    Reviewed: Allergy & Precautions, H&P , NPO status , Patient's Chart, lab work & pertinent test results  History of Anesthesia Complications (+) PONV and history of anesthetic complications  Airway Mallampati: III  TM Distance: >3 FB Neck ROM: full    Dental no notable dental hx.    Pulmonary former smoker   Pulmonary exam normal        Cardiovascular hypertension, Pt. on medications Normal cardiovascular exam     Neuro/Psych  PSYCHIATRIC DISORDERS Anxiety Depression    negative neurological ROS     GI/Hepatic negative GI ROS, Neg liver ROS,,,  Endo/Other  diabetes  Morbid obesity  Renal/GU      Musculoskeletal   Abdominal  (+) + obese  Peds  Hematology negative hematology ROS (+)   Anesthesia Other Findings Past Medical History: No date: Acute respiratory failure with hypoxia (HCC) No date: Chronic joint pain No date: Depression No date: Family history of breast cancer     Comment:  8/23 cancer genetic testing letter sent No date: Generalized anxiety disorder No date: History of kidney stones     Comment:  several times No date: Hypertension No date: IBS (irritable bowel syndrome) No date: Influenza with upper respiratory symptoms No date: Prediabetes No date: Rectal bleeding No date: Respiratory failure with hypoxia [facility])  Past Surgical History: 06/10/2023: BREAST BIOPSY; Left     Comment:  retro 3:00 coil, path pending 06/10/2023: BREAST BIOPSY; Left     Comment:  retro 4:00 heart clip path pending 06/10/2023: BREAST BIOPSY; Left     Comment:  [place] LT BREAST BX W LOC DEV 1ST LESION IMG BX SPEC [place]               GUIDE 06/10/2023 [facility] 06/10/2023: BREAST BIOPSY; Left     Comment:  [place] LT BREAST BX W LOC DEV EA ADD LESION IMG BX SPEC [place]               GUIDE 06/10/2023 [facility] 07/10/2023: BREAST  BIOPSY     Comment:  MM LT RADIO FREQUENCY TAG EA ADD LESION LOC MAMMO GUIDE               07/10/2023 [facility] 07/10/2023: BREAST BIOPSY; Left     Comment:  [place] LT RADIO FREQUENCY TAG LOC US GUIDE 07/10/2023               [facility] 05/30/2020: COLONOSCOPY WITH PROPOFOL; N/A     Comment:  Procedure: COLONOSCOPY WITH PROPOFOL;  Surgeon: Toney Reil, MD;  Location: [facility] ENDOSCOPY;  Service:               Gastroenterology;  Laterality: N/A; 11/29/2016: EXTRACORPOREAL SHOCK WAVE LITHOTRIPSY; Right     Comment:  Procedure: EXTRACORPOREAL SHOCK WAVE LITHOTRIPSY (ESWL);              Surgeon: Orson Ape, MD;  Location: [facility] ORS;                Service: Urology;  Laterality: Right; 2002: TUBAL LIGATION     Reproductive/Obstetrics negative OB ROS                             Anesthesia Physical Anesthesia Plan  ASA: 3  Anesthesia Plan: General   Post-op  Pain Management: Regional block*, Gabapentin PO (pre-op)* and Tylenol PO (pre-op)*   Induction: Intravenous  PONV Risk Score and Plan: Dexamethasone, Ondansetron, Midazolam, Treatment may vary due to age or medical condition and Propofol infusion  Airway Management Planned: Oral ETT  Additional Equipment:   Intra-op Plan:   Post-operative Plan: Extubation in OR  Informed Consent: I have reviewed the patients History and Physical, chart, labs and discussed the procedure including the risks, benefits and alternatives for the proposed anesthesia with the patient or authorized representative who has indicated his/her understanding and acceptance.     Dental Advisory Given  Plan Discussed with: Anesthesiologist, CRNA and Surgeon  Anesthesia Plan Comments:         Anesthesia Quick Evaluation

## 2023-07-30 NOTE — Anesthesia Procedure Notes (Signed)
Procedure Name: Intubation Date/Time: 07/30/2023 9:43 AM  Performed by: Emeterio Reeve, CRNAPre-anesthesia Checklist: Patient identified, Emergency Drugs available, Suction available and Patient being monitored Patient Re-evaluated:Patient Re-evaluated prior to induction Oxygen Delivery Method: Circle system utilized Preoxygenation: Pre-oxygenation with 100% oxygen Induction Type: IV induction Ventilation: Mask ventilation without difficulty Laryngoscope Size: Mac, McGraph and 4 Grade View: Grade I Tube type: Oral Tube size: 7.5 mm Number of attempts: 1 Airway Equipment and Method: Stylet and Oral airway Placement Confirmation: ETT inserted through vocal cords under direct vision, positive ETCO2 and breath sounds checked- equal and bilateral Secured at: 22 cm Tube secured with: Tape Dental Injury: Teeth and Oropharynx as per pre-operative assessment

## 2023-07-31 ENCOUNTER — Encounter: Payer: Self-pay | Admitting: Surgery

## 2023-08-02 NOTE — Progress Notes (Signed)
08/02/23  Called patient to discuss pathology results.  Overall specimen resected showed intraductal papilloma.  Margins are negative considering the additional tissue removed.  No evidence of malignancy or atypia.  Patient has follow up appointment with me on 08/14/23.  Henrene Dodge, MD

## 2023-08-14 ENCOUNTER — Ambulatory Visit: Payer: BC Managed Care – PPO | Admitting: Surgery

## 2023-08-14 ENCOUNTER — Encounter: Payer: Self-pay | Admitting: Surgery

## 2023-08-14 VITALS — BP 123/86 | HR 80 | Temp 98.3°F | Wt 320.0 lb

## 2023-08-14 DIAGNOSIS — Z09 Encounter for follow-up examination after completed treatment for conditions other than malignant neoplasm: Secondary | ICD-10-CM

## 2023-08-14 DIAGNOSIS — D242 Benign neoplasm of left breast: Secondary | ICD-10-CM

## 2023-08-14 NOTE — Progress Notes (Signed)
08/14/2023  HPI: Evelyn Estrada is a 42 y.o. female s/p left breast lumpectomy for intraductal papilloma with ADH on 07/30/23.  Patient presents for follow up.  Reports she's been doing well and reports only some soreness in the left breast.  Denies any worsening pain or any troubles with the incision itself.    Vital signs: BP 123/86 (BP Location: Right Arm, Patient Position: Sitting, Cuff Size: Large)   Pulse 80   Temp 98.3 F (36.8 C)   Wt (!) 320 lb (145.2 kg)   LMP 07/09/2023 (Exact Date)   SpO2 97%   BMI 43.40 kg/m    Physical Exam: Constitutional: No acute distress Breast:  Left breast s/p lumpectomy.  Periareolar incision is healing well and is clean, dry, intact.  Dermabond starting to peel off.  Assessment/Plan: This is a 42 y.o. female s/p left breast lumpectomy  --Patient is doing well without complications at this point.  Discussed pathology results with her again showing intraductal papilloma.  No residual atypia.  However, given that there was atypia on initial biopsy, will refer to Oncology team to discuss endocrine therapy for risk reduction. --Follow up in late April 2025 with repeat mammogram.   Howie Ill, MD [facility]

## 2023-09-02 ENCOUNTER — Other Ambulatory Visit: Payer: Self-pay | Admitting: Surgery

## 2024-03-02 ENCOUNTER — Other Ambulatory Visit: Payer: Self-pay

## 2024-03-02 DIAGNOSIS — D242 Benign neoplasm of left breast: Secondary | ICD-10-CM

## 2024-03-02 DIAGNOSIS — Z1231 Encounter for screening mammogram for malignant neoplasm of breast: Secondary | ICD-10-CM

## 2024-04-01 ENCOUNTER — Other Ambulatory Visit: Payer: Self-pay | Admitting: Nurse Practitioner

## 2024-04-01 DIAGNOSIS — Z1231 Encounter for screening mammogram for malignant neoplasm of breast: Secondary | ICD-10-CM

## 2024-04-10 ENCOUNTER — Inpatient Hospital Stay: Payer: Self-pay

## 2024-04-10 ENCOUNTER — Inpatient Hospital Stay: Payer: Self-pay | Attending: Oncology | Admitting: Oncology

## 2024-04-10 VITALS — BP 139/85 | HR 81 | Temp 98.2°F | Resp 18 | Ht 72.0 in | Wt 298.0 lb

## 2024-04-10 DIAGNOSIS — R760 Raised antibody titer: Secondary | ICD-10-CM | POA: Insufficient documentation

## 2024-04-10 DIAGNOSIS — Z87891 Personal history of nicotine dependence: Secondary | ICD-10-CM

## 2024-04-10 DIAGNOSIS — D75839 Thrombocytosis, unspecified: Secondary | ICD-10-CM

## 2024-04-10 DIAGNOSIS — R700 Elevated erythrocyte sedimentation rate: Secondary | ICD-10-CM | POA: Insufficient documentation

## 2024-04-10 DIAGNOSIS — E669 Obesity, unspecified: Secondary | ICD-10-CM | POA: Diagnosis not present

## 2024-04-10 DIAGNOSIS — R7982 Elevated C-reactive protein (CRP): Secondary | ICD-10-CM | POA: Insufficient documentation

## 2024-04-10 DIAGNOSIS — D72829 Elevated white blood cell count, unspecified: Secondary | ICD-10-CM

## 2024-04-10 DIAGNOSIS — E611 Iron deficiency: Secondary | ICD-10-CM | POA: Insufficient documentation

## 2024-04-10 DIAGNOSIS — D72820 Lymphocytosis (symptomatic): Secondary | ICD-10-CM

## 2024-04-10 LAB — CBC WITH DIFFERENTIAL/PLATELET
Abs Immature Granulocytes: 0.05 10*3/uL (ref 0.00–0.07)
Basophils Absolute: 0.1 10*3/uL (ref 0.0–0.1)
Basophils Relative: 1 %
Eosinophils Absolute: 0.2 10*3/uL (ref 0.0–0.5)
Eosinophils Relative: 2 %
HCT: 41.9 % (ref 36.0–46.0)
Hemoglobin: 13.6 g/dL (ref 12.0–15.0)
Immature Granulocytes: 1 %
Lymphocytes Relative: 32 %
Lymphs Abs: 3.2 10*3/uL (ref 0.7–4.0)
MCH: 29.4 pg (ref 26.0–34.0)
MCHC: 32.5 g/dL (ref 30.0–36.0)
MCV: 90.5 fL (ref 80.0–100.0)
Monocytes Absolute: 0.7 10*3/uL (ref 0.1–1.0)
Monocytes Relative: 7 %
Neutro Abs: 5.7 10*3/uL (ref 1.7–7.7)
Neutrophils Relative %: 57 %
Platelets: 380 10*3/uL (ref 150–400)
RBC: 4.63 MIL/uL (ref 3.87–5.11)
RDW: 13.3 % (ref 11.5–15.5)
WBC: 9.9 10*3/uL (ref 4.0–10.5)
nRBC: 0 % (ref 0.0–0.2)

## 2024-04-10 LAB — IRON AND TIBC
Iron: 57 ug/dL (ref 28–170)
Saturation Ratios: 15 % (ref 10.4–31.8)
TIBC: 376 ug/dL (ref 250–450)
UIBC: 319 ug/dL

## 2024-04-10 LAB — URIC ACID: Uric Acid, Serum: 5.3 mg/dL (ref 2.5–7.1)

## 2024-04-10 LAB — LACTATE DEHYDROGENASE: LDH: 79 U/L — ABNORMAL LOW (ref 98–192)

## 2024-04-10 LAB — SEDIMENTATION RATE: Sed Rate: 25 mm/h — ABNORMAL HIGH (ref 0–22)

## 2024-04-10 LAB — FERRITIN: Ferritin: 49 ng/mL (ref 11–307)

## 2024-04-10 LAB — C-REACTIVE PROTEIN: CRP: 1 mg/dL — ABNORMAL HIGH (ref <1.0)

## 2024-04-10 NOTE — Assessment & Plan Note (Signed)
 Likely secondary to iron deficiency Menorrhagia probably contributing to iron deficiency  - Will obtain JAK2 mutation, as patient also has leukocytosis to rule out polycythemia vera/ET/MPN -Will consider IV iron infusion if iron deficiency is confirmed

## 2024-04-10 NOTE — Assessment & Plan Note (Signed)
 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 Differential includes chronic inflammation, obesity, less likely chronic leukemia.  No B symptoms Remote family history of autoimmune diseases.  Obesity potential factor.  - Will obtain blood tests including CBC with differential, flow cytometry, autoimmune panel, ESR, LDH,uric acid, CRP  Return to clinic in 2 weeks to discuss results

## 2024-04-10 NOTE — Patient Instructions (Signed)
 VISIT SUMMARY:  Today, you were seen for persistent fatigue and elevated white blood cell count. You have been experiencing fatigue and joint pain for the past couple of years, with a noticeable worsening over time. Your blood work has shown a high white blood cell count since at least 2017, and you have a history of heavy menstrual bleeding. We discussed your symptoms, family history, and current medications.  YOUR PLAN:  -LEUKOCYTOSIS: Leukocytosis means having a high white blood cell count, which can be due to various reasons including chronic inflammation or obesity. We will conduct blood tests for leukemia, JAK2 mutation, lupus, and rheumatoid arthritis. We will review the results in two weeks.  -THROMBOCYTOSIS: Thrombocytosis means having a high platelet count, which can be due to iron deficiency or other factors. We will order iron studies and a JAK2 mutation test. If severe iron deficiency is confirmed, we may consider IV iron treatment.  -IRON DEFICIENCY: Iron deficiency can cause fatigue and is suspected due to your heavy menstrual bleeding. We will order iron studies and consider IV iron treatment if severe deficiency is confirmed. We discussed the side effects and premedication for IV iron.  INSTRUCTIONS:  Please schedule a follow-up appointment in two weeks to review your test results and discuss the management plan for leukocytosis, thrombocytosis, and iron deficiency.

## 2024-04-10 NOTE — Progress Notes (Signed)
 [facility]  HEMATOLOGY NEW VISIT  Jonah Negus, NP  REASON FOR REFERRAL: Leukocytosis  HISTORY OF PRESENT ILLNESS: Evelyn Estrada 43 y.o. female referred for leukocytosis. The patient is accompanied by her daughter today.  She has been experiencing persistent fatigue for the past couple of years, with a noticeable worsening over time. Alongside fatigue, she experiences joint pain, particularly in her knees, hips, and sometimes shoulders, for which she takes Tylenol  daily. No fever or chills, although she sometimes feels feverish. She has experienced significant weight loss of about 30 pounds over the past year, attributed to taking Ozempic. No significant loss of appetite. She has a history of heavy menstrual bleeding, especially since having her tubes tied, which led to her being placed on the pill until two years ago. She describes her periods as very heavy, requiring frequent changes of tampons and pads.  She takes potassium supplements after being found to have low potassium levels during a hospital visit for the flu.   She denies smoking for the past 15-16 years and does not consume alcohol. 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 Family history is significant for rheumatoid arthritis in her mother and grandmother, and lupus in a cousin.  I have reviewed the past medical history, past surgical history, social history and family history with the patient   ALLERGIES:  is allergic to phenergan [promethazine hcl].  MEDICATIONS:  Current Outpatient Medications  Medication Sig Dispense Refill   acetaminophen  (TYLENOL ) 500 MG tablet Take 500 mg by mouth every 6 (six) hours as needed for moderate pain.     acetaminophen  (TYLENOL ) 500 MG tablet Take 2 tablets (1,000 mg total) by mouth every 6 (six) hours as needed for mild pain.     buPROPion (WELLBUTRIN XL) 150 MG 24 hr tablet Take 150 mg by mouth every morning.      cetirizine (ZYRTEC) 10 MG chewable tablet Chew 10 mg by mouth in the morning and at bedtime.     escitalopram (LEXAPRO) 20 MG tablet Take 20 mg by mouth daily.     hydrochlorothiazide  (HYDRODIURIL ) 25 MG tablet Take 25 mg by mouth every morning.     hydrOXYzine (ATARAX/VISTARIL) 10 MG tablet Take 10 mg by mouth in the morning and at bedtime. Usually taken at night     ibuprofen  (ADVIL ) 600 MG tablet Take 1 tablet (600 mg total) by mouth every 8 (eight) hours as needed for moderate pain. 60 tablet 1   losartan  (COZAAR ) 50 MG tablet Take 50 mg by mouth every morning.     OZEMPIC, 2 MG/DOSE, 8 MG/3ML SOPN Inject 2 mg into the skin once a week. Taken on Monday July 29     potassium chloride  SA (KLOR-CON  M) 20 MEQ tablet Take 20 mEq by mouth at bedtime.     No current facility-administered medications for this visit.     REVIEW OF SYSTEMS:   Constitutional: Denies fevers, chills or night sweats Eyes: Denies blurriness of vision Ears, nose, mouth, throat, and face: Denies mucositis or sore throat Respiratory: Denies cough, dyspnea or wheezes Cardiovascular: Denies palpitation, chest discomfort or lower extremity swelling Gastrointestinal:  Denies nausea, heartburn or change in bowel habits Skin: Denies abnormal skin rashes Lymphatics: Denies new lymphadenopathy or easy bruising Neurological:Denies numbness, tingling or new weaknesses Behavioral/Psych: Mood is stable, no new changes  All other systems were reviewed with the patient and are negative.  PHYSICAL EXAMINATION:   Vitals:  04/10/24 1008  BP: 139/85  Pulse: 81  Resp: 18  Temp: 98.2 F (36.8 C)  SpO2: 97%    GENERAL:alert, no distress and comfortable SKIN: skin color, texture, turgor are normal, no rashes or significant lesions EYES: normal, Conjunctiva are pink and non-injected, sclera clear LYMPH:  no palpable lymphadenopathy in the cervical, axillary or inguinal LUNGS: clear to auscultation and percussion with  normal breathing effort HEART: regular rate & rhythm and no murmurs and no lower extremity edema ABDOMEN:abdomen soft, non-tender and normal bowel sounds Musculoskeletal:no cyanosis of digits and no clubbing  NEURO: alert & oriented x 3 with fluent speech  LABORATORY DATA:  I have reviewed the data as listed and labs from primary care collected on 03/26/2024 CBC: WBC: 11.7, hemoglobin: 13, hematocrit: 39.4, MCV: 89.1, platelets: 428 ANC: 6.05, ALC: 4660  Labs from 02/27/2024: CBC: WBC: 12.6, hemoglobin: 12.7, hematocrit: 38.8, MCV: 89.6, platelets: 419 ANC: 7043, ALC: 4221  CBC from 02/05/2024: WBC: 13.3, hemoglobin: 14, hematocrit: 42.5, MCV: 99.3, platelets: 432,  Lab Results  Component Value Date   WBC 9.9 04/10/2024   NEUTROABS 5.7 04/10/2024   HGB 13.6 04/10/2024   HCT 41.9 04/10/2024   MCV 90.5 04/10/2024   PLT 380 04/10/2024      Chemistry      Component Value Date/Time   NA 135 07/24/2023 1347   K 3.4 (L) 07/24/2023 1347   CL 107 07/24/2023 1347   CO2 22 07/24/2023 1347   BUN 11 07/24/2023 1347   CREATININE 0.75 07/24/2023 1347      Component Value Date/Time   CALCIUM 8.6 (L) 07/24/2023 1347   ALKPHOS 51 11/19/2021 0037   AST 50 (H) 11/19/2021 0037   ALT 39 11/19/2021 0037   BILITOT 0.4 11/19/2021 0037       ASSESSMENT & PLAN:  Patient is a 43 y.o. female referred for leukocytosis  Leukocytosis Chronic leukocytosis since at least 01/2022. Differential includes chronic inflammation, obesity, less likely chronic leukemia.  No B symptoms Remote family history of autoimmune diseases.  Obesity potential factor.  - Will obtain blood tests including CBC with differential, flow cytometry, autoimmune panel, ESR, LDH,uric acid, CRP  Return to clinic in 2 weeks to discuss results  Thrombocytosis Likely secondary to iron deficiency Menorrhagia probably contributing to iron deficiency  - Will obtain JAK2 mutation, as patient also has leukocytosis to rule out  polycythemia vera/ET/MPN -Will consider IV iron infusion if iron deficiency is confirmed   Orders Placed This Encounter  Procedures   CBC with Differential/Platelet    Standing Status:   Future    Number of Occurrences:   1    Expected Date:   04/10/2024    Expiration Date:   04/10/2025   Ferritin    Standing Status:   Future    Number of Occurrences:   1    Expected Date:   04/10/2024    Expiration Date:   04/10/2025   Iron and TIBC    Standing Status:   Future    Number of Occurrences:   1    Expected Date:   04/10/2024    Expiration Date:   04/10/2025   Lactate dehydrogenase    Standing Status:   Future    Number of Occurrences:   1    Expected Date:   04/10/2024    Expiration Date:   04/10/2025   Uric acid    Standing Status:   Future    Number of Occurrences:   1  Expected Date:   04/10/2024    Expiration Date:   04/10/2025   Flow Cytometry, Peripheral Blood (Oncology)    Standing Status:   Future    Number of Occurrences:   1    Expected Date:   04/10/2024    Expiration Date:   04/10/2025   C-reactive protein    Standing Status:   Future    Number of Occurrences:   1    Expected Date:   04/10/2024    Expiration Date:   04/10/2025   Sedimentation rate    Standing Status:   Future    Number of Occurrences:   1    Expected Date:   04/10/2024    Expiration Date:   04/10/2025   JAK2 V617F rfx CALR/MPL/E12-15    Standing Status:   Future    Number of Occurrences:   1    Expected Date:   04/10/2024    Expiration Date:   04/10/2025   ANA, IFA (with reflex)    Standing Status:   Future    Number of Occurrences:   1    Expected Date:   04/10/2024    Expiration Date:   04/10/2025   Rheumatoid factor    Standing Status:   Future    Number of Occurrences:   1    Expected Date:   04/10/2024    Expiration Date:   04/10/2025   Cyclic Citrul Peptide Antibody, IGG/IGA    Standing Status:   Future    Number of Occurrences:   1    Expected Date:   04/10/2024    Expiration Date:    04/10/2025    The total time spent in the appointment was 40 minutes encounter with patients including review of chart and various tests results, discussions about plan of care and coordination of care plan   All questions were answered. The patient knows to call the clinic with any problems, questions or concerns. No barriers to learning was detected.   Eduardo Grade, MD 4/18/202511:49 AM

## 2024-04-11 LAB — RHEUMATOID FACTOR: Rheumatoid fact SerPl-aCnc: <10 [IU]/mL (ref <14.0)

## 2024-04-13 ENCOUNTER — Ambulatory Visit: Payer: Self-pay | Admitting: Surgery

## 2024-04-14 LAB — CYCLIC CITRUL PEPTIDE ANTIBODY, IGG/IGA: CCP Antibodies IgG/IgA: 8 U (ref 0–19)

## 2024-04-14 LAB — SURGICAL PATHOLOGY

## 2024-04-15 LAB — FLOW CYTOMETRY

## 2024-04-17 LAB — JAK2 V617F RFX CALR/MPL/E12-15

## 2024-04-17 LAB — CALR +MPL + E12-E15  (REFLEX)

## 2024-04-18 LAB — FANA STAINING PATTERNS: Homogeneous Pattern: 1

## 2024-04-18 LAB — ANTINUCLEAR ANTIBODIES, IFA: ANA Ab, IFA: POSITIVE — AB

## 2024-04-24 ENCOUNTER — Inpatient Hospital Stay: Attending: Oncology | Admitting: Oncology

## 2024-04-24 VITALS — BP 135/89 | HR 87 | Temp 97.9°F | Resp 16 | Ht 72.0 in | Wt 302.5 lb

## 2024-04-24 DIAGNOSIS — D72829 Elevated white blood cell count, unspecified: Secondary | ICD-10-CM | POA: Diagnosis not present

## 2024-04-24 DIAGNOSIS — Z862 Personal history of diseases of the blood and blood-forming organs and certain disorders involving the immune mechanism: Secondary | ICD-10-CM | POA: Diagnosis not present

## 2024-04-24 DIAGNOSIS — Z79899 Other long term (current) drug therapy: Secondary | ICD-10-CM | POA: Insufficient documentation

## 2024-04-24 DIAGNOSIS — E611 Iron deficiency: Secondary | ICD-10-CM | POA: Insufficient documentation

## 2024-04-24 DIAGNOSIS — D75839 Thrombocytosis, unspecified: Secondary | ICD-10-CM | POA: Insufficient documentation

## 2024-04-24 MED ORDER — FERROUS SULFATE 325 (65 FE) MG PO TBEC: 325.0000 mg | DELAYED_RELEASE_TABLET | ORAL | 3 refills | Status: AC

## 2024-04-24 NOTE — Patient Instructions (Signed)
 VISIT SUMMARY:  You came in today for a follow-up on your previously abnormal blood test results. Your repeat blood tests showed that your white blood cell count and platelet levels have returned to normal. Tests for leukemia were negative, and although your inflammatory markers were slightly elevated, you have no significant symptoms of inflammation. An ANA test was mildly positive, but other tests for autoimmune diseases were negative.  YOUR PLAN:  -IRON DEFICIENCY: Iron deficiency means that your body has lower than normal levels of iron, which can cause symptoms like fatigue and general weakness. To address this, you should take iron supplements every other day. It's best to take the iron with orange juice and avoid coffee when you do. If you experience constipation, you can use Miralax . You can buy the iron supplements over-the-counter if it's cheaper than using a prescription.  -ELEVATED CRP: CRP is a marker in your blood that can indicate inflammation. Your CRP levels are slightly elevated, but since you don't have any significant symptoms, no treatment is needed at this time.  -MILDLY POSITIVE ANA: An ANA test checks for autoimmune diseases. Your test was mildly positive, but since other tests for autoimmune diseases were negative, this result is not considered clinically significant.  INSTRUCTIONS:  Please follow the recommendations for iron supplementation as discussed. No further action is needed for the elevated CRP and mildly positive ANA at this time. If you experience any new symptoms or have concerns, please schedule a follow-up appointment.

## 2024-04-24 NOTE — Assessment & Plan Note (Signed)
 Likely secondary to iron deficiency Menorrhagia probably contributing to iron deficiency JAK2 testing: Negative Thrombocytosis resolved at this time  - Patient has borderline low TSAT levels.  Recommended patient to take ferrous sulfate every other day orally.  Take MiraLAX  for constipation

## 2024-04-24 NOTE — Progress Notes (Signed)
 [facility] at [facility]  HEMATOLOGY FOLLOW-UP VISIT  Evelyn Negus, NP  REASON FOR FOLLOW-UP: Leukocytosis and thrombocytosis  ASSESSMENT & PLAN:  Patient is a 43 y.o. 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  Resolved at this time No B symptoms Remote family history of autoimmune diseases.  Obesity potential factor. ESR, CRP: Slightly increased Flow cytometry: Negative LDH, uric acid: Normal Slightly elevated ANA but rest of the rheumatological workup was negative.  This is a very nonspecific finding.  - Leukocytosis likely secondary to obesity or chronic inflammation.  Currently resolved at this time with no B symptoms.  No other hematological needs at this time, will discharge to primary care to monitor CBC frequently.  Recommended patient to reach out to us  in future if she has any needs or concerns.  Thrombocytosis Likely secondary to iron deficiency Menorrhagia probably contributing to iron deficiency JAK2 testing: Negative Thrombocytosis resolved at this time  - Patient has borderline low TSAT levels.  Recommended patient to take ferrous sulfate every other day orally.  Take MiraLAX  for constipation   No orders of the defined types were placed in this encounter.   The total time spent in the appointment was 30 minutes encounter with patients including review of chart and various tests results, discussions about plan of care and coordination of care plan   All questions were answered. The patient knows to call the clinic with any problems, questions or concerns. No barriers to learning was detected.  Eduardo Grade, MD 5/2/20251:57 PM   SUMMARY OF HEMATOLOGIC HISTORY: Leukocytosis: Likely secondary to obesity or chronic inflammation ESR, CRP: Slightly increased Flow cytometry: Negative LDH, uric acid: Normal Slightly elevated ANA but rest of the  rheumatological workup was negative.  Thrombocytosis: Likely secondary to iron deficiency  JAK2/MPL/CALR/E12-15: Negative  INTERVAL HISTORY: Evelyn Estrada 43 y.o. female following for leukocytosis and thrombocytosis. Patient was accompanied by her daughter today.  Patient has no complaints today.  No significant change in the past 2 weeks.  We discussed that the lab results are mostly negative and her CBC was normal and we repeated it.  She does have some mild iron deficiency and recommended oral iron supplementation.  All the questions and concerns were answered.  I have reviewed the past medical history, past surgical history, social history and family history with the patient   ALLERGIES:  is allergic to phenergan [promethazine hcl].  MEDICATIONS:  Current Outpatient Medications  Medication Sig Dispense Refill   acetaminophen  (TYLENOL ) 500 MG tablet Take 500 mg by mouth every 6 (six) hours as needed for moderate pain.     acetaminophen  (TYLENOL ) 500 MG tablet Take 2 tablets (1,000 mg total) by mouth every 6 (six) hours as needed for mild pain.     buPROPion (WELLBUTRIN XL) 150 MG 24 hr tablet Take 150 mg by mouth every morning.     cetirizine (ZYRTEC) 10 MG chewable tablet Chew 10 mg by mouth in the morning and at bedtime.     escitalopram (LEXAPRO) 20 MG tablet Take 20 mg by mouth daily.     ferrous sulfate 325 (65 FE) MG EC tablet Take 1 tablet (325 mg total) by mouth every other day. 45 tablet 3   hydrochlorothiazide  (HYDRODIURIL ) 25 MG tablet Take 25 mg by mouth every morning.     hydrOXYzine (ATARAX/VISTARIL) 10 MG tablet Take 10 mg by mouth in the morning and at bedtime. Usually taken at night  ibuprofen  (ADVIL ) 600 MG tablet Take 1 tablet (600 mg total) by mouth every 8 (eight) hours as needed for moderate pain. 60 tablet 1   losartan  (COZAAR ) 50 MG tablet Take 50 mg by mouth every morning.     OZEMPIC, 2 MG/DOSE, 8 MG/3ML SOPN Inject 2 mg into the skin once a week.  Taken on Monday July 29     potassium chloride  SA (KLOR-CON  M) 20 MEQ tablet Take 20 mEq by mouth at bedtime.     No current facility-administered medications for this visit.     REVIEW OF SYSTEMS:   Constitutional: Denies fevers, chills or night sweats Eyes: Denies blurriness of vision Ears, nose, mouth, throat, and face: Denies mucositis or sore throat Respiratory: Denies cough, dyspnea or wheezes Cardiovascular: Denies palpitation, chest discomfort or lower extremity swelling Gastrointestinal:  Denies nausea, heartburn or change in bowel habits Skin: Denies abnormal skin rashes Lymphatics: Denies new lymphadenopathy or easy bruising Neurological:Denies numbness, tingling or new weaknesses Behavioral/Psych: Mood is stable, no new changes  All other systems were reviewed with the patient and are negative.  PHYSICAL EXAMINATION:   Vitals:   04/24/24 1310  BP: 135/89  Pulse: 87  Resp: 16  Temp: 97.9 F (36.6 C)  SpO2: 98%    GENERAL:alert, no distress and comfortable SKIN: skin color, texture, turgor are normal, no rashes or significant lesions LUNGS: clear to auscultation and percussion with normal breathing effort HEART: regular rate & rhythm and no murmurs and no lower extremity edema ABDOMEN:abdomen soft, non-tender and normal bowel sounds Musculoskeletal:no cyanosis of digits and no clubbing  NEURO: alert & oriented x 3 with fluent speech  LABORATORY DATA:  I have reviewed the data as listed  Lab Results  Component Value Date   WBC 9.9 04/10/2024   NEUTROABS 5.7 04/10/2024   HGB 13.6 04/10/2024   HCT 41.9 04/10/2024   MCV 90.5 04/10/2024   PLT 380 04/10/2024      Chemistry      Component Value Date/Time   NA 135 07/24/2023 1347   K 3.4 (L) 07/24/2023 1347   CL 107 07/24/2023 1347   CO2 22 07/24/2023 1347   BUN 11 07/24/2023 1347   CREATININE 0.75 07/24/2023 1347      Component Value Date/Time   CALCIUM 8.6 (L) 07/24/2023 1347   ALKPHOS 51  11/19/2021 0037   AST 50 (H) 11/19/2021 0037   ALT 39 11/19/2021 0037   BILITOT 0.4 11/19/2021 0037      Latest Reference Range & Units 04/10/24 10:36  Uric Acid, Serum 2.5 - 7.1 mg/dL 5.3  LDH 98 - 034 U/L 79 (L)  Iron 28 - 170 ug/dL 57  UIBC ug/dL 742  TIBC 595 - 638 ug/dL 756  Saturation Ratios 10.4 - 31.8 % 15  Ferritin 11 - 307 ng/mL 49  CRP <1.0 mg/dL 1.0 (H)  (L): Data is abnormally low (H): Data is abnormally high  Latest Reference Range & Units 04/10/24 10:36  Sed Rate 0 - 22 mm/hr 25 (H)  ANA Ab, IFA  Positive !  CCP Antibodies IgG/IgA 0 - 19 units 8  RA Latex Turbid. <14.0 IU/mL <10.0  Homogeneous Pattern  1  NOTE:  Comment  (H): Data is abnormally high !: Data is abnormal  Latest Reference Range & Units 04/10/24 10:36  CCP Antibodies IgG/IgA 0 - 19 units 8  Flow Cytometry  Negative  CALR +MPL + E12-E15  (REFLEX)  Negative  JAK2 [id number] RFX CALR/MPL/E12-15  Negative

## 2024-04-24 NOTE — Assessment & Plan Note (Signed)
 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  Resolved at this time No B symptoms Remote family history of autoimmune diseases.  Obesity potential factor. ESR, CRP: Slightly increased Flow cytometry: Negative LDH, uric acid: Normal Slightly elevated ANA but rest of the rheumatological workup was negative.  This is a very nonspecific finding.  - Leukocytosis likely secondary to obesity or chronic inflammation.  Currently resolved at this time with no B symptoms.  No other hematological needs at this time, will discharge to primary care to monitor CBC frequently.  Recommended patient to reach out to us  in future if she has any needs or concerns.

## 2024-04-29 ENCOUNTER — Ambulatory Visit: Payer: Self-pay | Admitting: Surgery

## 2024-06-05 ENCOUNTER — Telehealth: Payer: Self-pay

## 2024-06-05 NOTE — Telephone Encounter (Signed)
 Message left for the patient reminding her to have her mammogram and follow up with Dr Mauri Sous rescheduled. She may call here with any questions.

## 2024-07-07 ENCOUNTER — Ambulatory Visit
Admission: RE | Admit: 2024-07-07 | Discharge: 2024-07-07 | Disposition: A | Discharge status: Home / Self Care | Payer: Self-pay | Source: Ambulatory Visit | Attending: Nurse Practitioner | Admitting: Nurse Practitioner

## 2024-07-07 DIAGNOSIS — Z1231 Encounter for screening mammogram for malignant neoplasm of breast: Secondary | ICD-10-CM | POA: Diagnosis present

## 2024-07-20 ENCOUNTER — Ambulatory Visit: Admitting: Surgery
